# Patient Record
Sex: Female | Born: 1970 | Race: White | Hispanic: No | Marital: Married | Smoking: Never smoker
Health system: Southern US, Community
[De-identification: ages and names within clinical notes are randomized; demographics above are authoritative.]

## PROBLEM LIST (undated history)

## (undated) DIAGNOSIS — T7840XA Allergy, unspecified, initial encounter: Secondary | ICD-10-CM

## (undated) DIAGNOSIS — R7303 Prediabetes: Principal | ICD-10-CM

## (undated) HISTORY — PX: CHOLECYSTECTOMY: SHX55

## (undated) HISTORY — DX: Prediabetes: R73.03

## (undated) HISTORY — DX: Allergy, unspecified, initial encounter: T78.40XA

---

## 2008-08-17 ENCOUNTER — Observation Stay: Payer: Self-pay | Admitting: Obstetrics and Gynecology

## 2008-09-07 ENCOUNTER — Observation Stay: Payer: Self-pay | Admitting: Obstetrics and Gynecology

## 2008-10-17 ENCOUNTER — Inpatient Hospital Stay: Payer: Self-pay

## 2011-12-11 ENCOUNTER — Ambulatory Visit: Payer: Self-pay | Admitting: Obstetrics and Gynecology

## 2012-03-29 LAB — COMPREHENSIVE METABOLIC PANEL
Alkaline Phosphatase: 177 U/L — ABNORMAL HIGH (ref 50–136)
Anion Gap: 8 (ref 7–16)
BUN: 10 mg/dL (ref 7–18)
Bilirubin,Total: 7.5 mg/dL — ABNORMAL HIGH (ref 0.2–1.0)
Co2: 29 mmol/L (ref 21–32)
Creatinine: 0.73 mg/dL (ref 0.60–1.30)
EGFR (Non-African Amer.): >60
Osmolality: 281 (ref 275–301)
SGOT(AST): 459 U/L — ABNORMAL HIGH (ref 15–37)
SGPT (ALT): 915 U/L — ABNORMAL HIGH
Total Protein: 8.2 g/dL (ref 6.4–8.2)

## 2012-03-29 LAB — CBC
HCT: 47.1 % — ABNORMAL HIGH (ref 35.0–47.0)
MCHC: 32.7 g/dL (ref 32.0–36.0)
MCV: 87 fL (ref 80–100)
Platelet: 237 10*3/uL (ref 150–440)
RBC: 5.43 10*6/uL — ABNORMAL HIGH (ref 3.80–5.20)

## 2012-03-29 LAB — URINALYSIS, COMPLETE
Glucose,UR: NEGATIVE mg/dL (ref 0–75)
Ketone: NEGATIVE
Leukocyte Esterase: NEGATIVE
Ph: 6 (ref 4.5–8.0)
Protein: NEGATIVE
Specific Gravity: 1.01 (ref 1.003–1.030)
WBC UR: 1 /HPF (ref 0–5)

## 2012-03-29 LAB — LIPASE, BLOOD: Lipase: 159 U/L (ref 73–393)

## 2012-03-30 ENCOUNTER — Inpatient Hospital Stay: Payer: Self-pay | Admitting: Surgery

## 2012-03-30 LAB — HEPATIC FUNCTION PANEL A (ARMC)
Albumin: 3.5 g/dL (ref 3.4–5.0)
Alkaline Phosphatase: 178 U/L — ABNORMAL HIGH (ref 50–136)
Bilirubin, Direct: 2.9 mg/dL — ABNORMAL HIGH (ref 0.00–0.20)
SGOT(AST): 326 U/L — ABNORMAL HIGH (ref 15–37)
SGPT (ALT): 834 U/L — ABNORMAL HIGH

## 2012-03-30 LAB — CBC WITH DIFFERENTIAL/PLATELET
Basophil #: 0.5 10*3/uL — ABNORMAL HIGH (ref 0.0–0.1)
Eosinophil #: 0.1 10*3/uL (ref 0.0–0.7)
HCT: 44.9 % (ref 35.0–47.0)
HGB: 14.9 g/dL (ref 12.0–16.0)
Lymphocyte #: 1.6 10*3/uL (ref 1.0–3.6)
Lymphocyte %: 22.5 %
MCH: 28.7 pg (ref 26.0–34.0)
MCHC: 33.1 g/dL (ref 32.0–36.0)
MCV: 87 fL (ref 80–100)
Monocyte #: 0.2 x10 3/mm (ref 0.2–0.9)
Neutrophil #: 4.8 10*3/uL (ref 1.4–6.5)
Platelet: 232 10*3/uL (ref 150–440)
RBC: 5.18 10*6/uL (ref 3.80–5.20)
RDW: 13.4 % (ref 11.5–14.5)
WBC: 7.2 10*3/uL (ref 3.6–11.0)

## 2012-03-31 LAB — HEPATIC FUNCTION PANEL A (ARMC)
Albumin: 2.9 g/dL — ABNORMAL LOW (ref 3.4–5.0)
Alkaline Phosphatase: 140 U/L — ABNORMAL HIGH (ref 50–136)
Bilirubin, Direct: 2.5 mg/dL — ABNORMAL HIGH (ref 0.00–0.20)
SGOT(AST): 198 U/L — ABNORMAL HIGH (ref 15–37)
SGPT (ALT): 625 U/L — ABNORMAL HIGH
Total Protein: 6.7 g/dL (ref 6.4–8.2)

## 2012-03-31 LAB — BASIC METABOLIC PANEL
BUN: 6 mg/dL — ABNORMAL LOW (ref 7–18)
Calcium, Total: 8.1 mg/dL — ABNORMAL LOW (ref 8.5–10.1)
Chloride: 106 mmol/L (ref 98–107)
Co2: 26 mmol/L (ref 21–32)
Creatinine: 0.82 mg/dL (ref 0.60–1.30)
EGFR (Non-African Amer.): >60
Glucose: 124 mg/dL — ABNORMAL HIGH (ref 65–99)
Osmolality: 278 (ref 275–301)
Potassium: 3.9 mmol/L (ref 3.5–5.1)
Sodium: 140 mmol/L (ref 136–145)

## 2012-03-31 LAB — CBC WITH DIFFERENTIAL/PLATELET
Eosinophil #: 0.3 10*3/uL (ref 0.0–0.7)
Lymphocyte #: 2.7 10*3/uL (ref 1.0–3.6)
Lymphocyte %: 39.1 %
MCH: 28.7 pg (ref 26.0–34.0)
MCV: 87 fL (ref 80–100)
Monocyte #: 0.4 x10 3/mm (ref 0.2–0.9)
Monocyte %: 5.8 %
Neutrophil %: 50.7 %
Platelet: 187 10*3/uL (ref 150–440)
RDW: 13.3 % (ref 11.5–14.5)
WBC: 6.9 10*3/uL (ref 3.6–11.0)

## 2012-04-01 LAB — CBC WITH DIFFERENTIAL/PLATELET
Basophil #: 0 10*3/uL (ref 0.0–0.1)
Eosinophil #: 0.1 10*3/uL (ref 0.0–0.7)
Eosinophil %: 1.8 %
Lymphocyte #: 2.4 10*3/uL (ref 1.0–3.6)
MCHC: 33.3 g/dL (ref 32.0–36.0)
MCV: 87 fL (ref 80–100)
Monocyte #: 0.4 x10 3/mm (ref 0.2–0.9)
Neutrophil #: 5 10*3/uL (ref 1.4–6.5)
Platelet: 194 10*3/uL (ref 150–440)
RBC: 4.71 10*6/uL (ref 3.80–5.20)
RDW: 13.4 % (ref 11.5–14.5)

## 2012-04-01 LAB — COMPREHENSIVE METABOLIC PANEL
Albumin: 3 g/dL — ABNORMAL LOW (ref 3.4–5.0)
Alkaline Phosphatase: 123 U/L (ref 50–136)
Anion Gap: 7 (ref 7–16)
BUN: 4 mg/dL — ABNORMAL LOW (ref 7–18)
Calcium, Total: 8.3 mg/dL — ABNORMAL LOW (ref 8.5–10.1)
Chloride: 107 mmol/L (ref 98–107)
Co2: 25 mmol/L (ref 21–32)
Creatinine: 0.62 mg/dL (ref 0.60–1.30)
EGFR (Non-African Amer.): >60
Osmolality: 275 (ref 275–301)
Sodium: 139 mmol/L (ref 136–145)
Total Protein: 6.4 g/dL (ref 6.4–8.2)

## 2012-04-01 LAB — LIPASE, BLOOD: Lipase: 459 U/L — ABNORMAL HIGH (ref 73–393)

## 2012-04-01 LAB — HEPATIC FUNCTION PANEL A (ARMC): Bilirubin, Direct: 1.2 mg/dL — ABNORMAL HIGH (ref 0.00–0.20)

## 2012-05-17 ENCOUNTER — Ambulatory Visit: Payer: Self-pay | Admitting: Surgery

## 2012-05-17 LAB — CBC WITH DIFFERENTIAL/PLATELET
Basophil %: 0.4 %
Eosinophil %: 1.7 %
Lymphocyte %: 32 %
MCHC: 33.4 g/dL (ref 32.0–36.0)
MCV: 87 fL (ref 80–100)
Monocyte %: 6.2 %
Neutrophil #: 6.1 10*3/uL (ref 1.4–6.5)
Neutrophil %: 59.7 %
Platelet: 231 10*3/uL (ref 150–440)
RDW: 13.7 % (ref 11.5–14.5)

## 2012-05-17 LAB — HEPATIC FUNCTION PANEL A (ARMC)
Albumin: 3.5 g/dL (ref 3.4–5.0)
Alkaline Phosphatase: 73 U/L (ref 50–136)
Bilirubin, Direct: 0.1 mg/dL (ref 0.00–0.20)
SGOT(AST): 17 U/L (ref 15–37)
Total Protein: 7.5 g/dL (ref 6.4–8.2)

## 2012-05-18 LAB — PATHOLOGY REPORT

## 2012-06-02 ENCOUNTER — Emergency Department: Payer: Self-pay | Admitting: Emergency Medicine

## 2012-06-02 ENCOUNTER — Ambulatory Visit: Payer: Self-pay | Admitting: Gastroenterology

## 2012-06-02 LAB — COMPREHENSIVE METABOLIC PANEL
Albumin: 3.6 g/dL (ref 3.4–5.0)
Anion Gap: 10 (ref 7–16)
BUN: 10 mg/dL (ref 7–18)
Calcium, Total: 8.7 mg/dL (ref 8.5–10.1)
Co2: 26 mmol/L (ref 21–32)
EGFR (Non-African Amer.): >60
Glucose: 129 mg/dL — ABNORMAL HIGH (ref 65–99)
Potassium: 3.9 mmol/L (ref 3.5–5.1)
SGOT(AST): 80 U/L — ABNORMAL HIGH (ref 15–37)
SGPT (ALT): 61 U/L
Sodium: 139 mmol/L (ref 136–145)
Total Protein: 7.6 g/dL (ref 6.4–8.2)

## 2012-06-02 LAB — URINALYSIS, COMPLETE
Bacteria: NONE SEEN
Bilirubin,UR: NEGATIVE
Blood: NEGATIVE
Glucose,UR: NEGATIVE mg/dL (ref 0–75)
Ketone: NEGATIVE
Ph: 5 (ref 4.5–8.0)
RBC,UR: 2 /HPF (ref 0–5)
Specific Gravity: 1.019 (ref 1.003–1.030)
Squamous Epithelial: 4

## 2012-06-02 LAB — CBC
HCT: 42.6 % (ref 35.0–47.0)
MCHC: 34.2 g/dL (ref 32.0–36.0)
MCV: 85 fL (ref 80–100)
RDW: 13.5 % (ref 11.5–14.5)

## 2012-06-02 LAB — LIPASE, BLOOD: Lipase: 126 U/L (ref 73–393)

## 2012-06-02 LAB — PREGNANCY, URINE: Pregnancy Test, Urine: NEGATIVE m[IU]/mL

## 2012-12-13 ENCOUNTER — Ambulatory Visit: Payer: Self-pay | Admitting: Obstetrics and Gynecology

## 2013-12-27 ENCOUNTER — Ambulatory Visit: Payer: Self-pay | Admitting: Obstetrics and Gynecology

## 2015-02-20 NOTE — Consult Note (Signed)
Brief Consult Note: Diagnosis: right upper quadrant pain.   Comments: Patient called answering service today about 10am with c/o epigastric /ruq pain.  She had an ercp to remove a previously placed biliary stent and removal of gallstone, also recent ccy, with prior ercp to attempt removal of gallstone, stent placed at that time.  Patient went to ER on evening of ERCP with c/o pain.  Was evaluated there, thought to have biliary spasm, given pain med, discharged.  Normal lipase. Currently the abdominal pain is smaller in area, but about the same intensity.  Denies n/v or fever. Normal bm. Didnt want to take the narcotic for pain as it made her sleepy with young child at home.  Rx bentyl 10 mgo po q 6 hours prn #10, protonix 40 mg po bid for 3 days, continue daily for a week.  Patient to call Dr Candace Cruise office monday, and to call me back this evening.  Electronic Signatures: Loistine Simas (MD)  (Signed 03-Aug-13 10:29)  Authored: Brief Consult Note   Last Updated: 03-Aug-13 10:29 by Loistine Simas (MD)

## 2015-02-25 NOTE — Consult Note (Signed)
Prob ampullary stenosis. Difficulty with cannulation. Needle knife sphincterotomy performed to access CBD. No obvious stones. Bile drainage with sphincterotomy. 7Fr x 5 cm biliary stent placed. Keep NPO resto of today. Check LFT and lipase in AM. Watch for pancreatitis. Thanks.  Electronic Signatures: Verdie Shire (MD)  (Signed on 29-May-13 18:08)  Authored  Last Updated: 29-May-13 18:08 by Verdie Shire (MD)

## 2015-02-25 NOTE — Consult Note (Signed)
Pt total bilirubin not changed from yesterday.  I think Dr. Candace Cruise will be interested in doing an ERCP before surgery and will talk to him today about the patient.  Electronic Signatures: Manya Silvas (MD)  (Signed on 29-May-13 07:51)  Authored  Last Updated: 29-May-13 07:51 by Manya Silvas (MD)

## 2015-02-25 NOTE — Consult Note (Signed)
PATIENT NAME:  Elizabeth Dalton, Elizabeth Dalton MR#:  [id number] DATE OF BIRTH:  08-27-1971  DATE OF CONSULTATION:  03/30/2012  CONSULTING PHYSICIAN:  Theodore Demark, NP PRIMARY CARE PHYSICIAN: Calla Kicks, MD   HISTORY OF PRESENT ILLNESS: Ms. Delfino Lovett is a pleasant 44 year old Caucasian woman admitted for abdominal pain. GI has been consulted at the request of Dr. Pat Patrick to evaluate for possible common bile duct obstruction. The patient gives a history of rare and infrequent right upper quadrant discomfort that she could not associate with anything that actually recurred three days ago after eating; and she also had increased bloating, acid reflux, heartburn and dyspepsia with it. She does state she ran a low-grade temperature at 99.2 two days ago, but this has resolved. She had no nausea or vomiting. She has not noticed any constipation or difficulty passing stools but did note that her stool was cement-colored. She has no further GI complaints. Her bilirubin has been 7.5, ALP 177, AST 459, ALT 915. She has no leukocytosis, and her labs were pretty much otherwise unremarkable. As noted in the History and Physical, she does deny any history of hepatitis, jaundice, pancreatitis, peptic ulcer disease, gallbladder disease, or other gastrointestinal-related issues. She denies medical problems and surgical problems. Her only hospitalization has been in relation to natural childbirth.   MEDICATIONS: Multivitamin, otherwise no regular medications.   ALLERGIES: No known drug allergies.   SOCIAL HISTORY: Rare alcohol. No tobacco. No illicits. 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   REVIEW OF SYSTEMS: A ten-point review of systems is unremarkable.   FAMILY HISTORY: Mother with colon polyps, otherwise negative for liver disease, colorectal cancer, peptic ulcer disease, gallbladder disease.   LABORATORY, DIAGNOSTIC AND RADIOLOGICAL DATA:  Glucose 150, BUN 10, creatinine 0.73, serum sodium 140,  potassium 4.0, chloride 103, GFR greater than 60, calcium 8.8, lipase 159. Total serum protein 8.2, albumin 3.8, total bilirubin 7.5, ALP 177, AST 459, ALT 915.  WBC 7.9, hemoglobin 15.4, hematocrit 47.1, platelet count 237. Red cells are normocytic. Urinalysis is negative for signs of urinary tract infection.  Abdominal ultrasound did show gallstones and sludge, was negative for gallbladder wall thickness and pericholecystic fluid. Common duct bile duct measured 4 to 5 mm in diameter. Her liver was unremarkable.   PHYSICAL EXAMINATION:  VITAL SIGNS: Most recent vital signs: Temperature 98.2, pulse 78, respiratory rate 20, blood pressure 133/85, oxygen saturation 99%.   GENERAL: Well-appearing woman sitting in a chair grading homework. No acute distress.   PSYCHIATRIC: Pleasant, cooperative, logical train of thought. Good memory skills.   HEENT: Normocephalic, atraumatic. Sclerae with mild jaundice. No redness, drainage, or inflammation to eyes or nares. Mouth with pink moist mucous membranes.   NECK: Supple. No adenopathy, thyromegaly, or JVD.   CHEST: Respirations eupneic. Lungs are clear to auscultation bilaterally.   CARDIAC: S1, S2. Regular rate and rhythm. No MRG. Peripheral pulses are 2+. No appreciable edema.   ABDOMEN: Nondistended. Active bowel sounds x4, nontender, soft. No rebound, tenderness, guarding, masses, hernias, hepatosplenomegaly or peritoneal signs.   GENITOURINARY: Deferred.   RECTAL: Deferred.   EXTREMITIES: Strength five out of five. Moves all extremities well x4. No clubbing or cyanosis.   SKIN: No erythema, lesion or rash. Cranial nerves II through XII are grossly intact. Alert and oriented x3. Speech clear. No facial droop.   IMPRESSION AND PLAN: Abdominal pain, dyspepsia, obstructive pattern of liver enzymes, cholelithiasis and sludge, and abdominal US without signs of acute cholecystitis. She is afebrile, feeling  better today. We will plan for MRCP today  with possible ERCP by Dr. Candace Cruise tomorrow. Thank you for this consult. GI will be happy to follow.  These services were provided by Stephens November, MSN, [place] in collaboration with Manya Silvas, M.D. with whom I have discussed this patient in full.   ____________________________ Theodore Demark, NP chl:cbb D: 03/30/2012 18:14:42 ET T: 03/30/2012 18:50:30 ET JOB#: [id number]  cc: Theodore Demark, NP, <Dictator> [place] SIGNED 03/31/2012 7:04

## 2015-02-25 NOTE — Consult Note (Signed)
Brief Consult Note: Diagnosis: Abdominal pain.   Patient was seen by consultant.   Consult note dictated.   Comments: Appreciate consult for 44 y/o caucasian woman with no significant medical history other than childbirth, for evaluation of possible common bile duct obstruction. Impression and Plan: Abdominal pain, dyspepsia. Obstructive pattern of liver enzymes. Cholelithiasis and sludge on abominal [place] without signs of acute cholecystitis. Afebrile. Feeling better today. Will plan for MRCP today and possible ERCP with Dr Candace Cruise tomorrow.  Thank you for this consult.  Electronic Signatures: Stephens November H (NP)  (Signed 28-May-13 12:56)  Authored: Brief Consult Note   Last Updated: 28-May-13 12:56 by Theodore Demark (NP)

## 2015-02-25 NOTE — Consult Note (Signed)
TB down, MRCP did not show stone.  Will repeat labs in morning and present patient to Dr. Candace Cruise for his decision to do pre op ERCP or not.  Will give clear liq now.  Electronic Signatures: Manya Silvas (MD)  (Signed on 28-May-13 17:57)  Authored  Last Updated: 28-May-13 17:57 by Manya Silvas (MD)

## 2015-02-25 NOTE — Discharge Summary (Signed)
PATIENT NAME:  SZMURIGA CECILA, SATCHER MR#:  [id number] DATE OF BIRTH:  Apr 16, 1971  DATE OF ADMISSION:  03/30/2012 DATE OF DISCHARGE:  04/01/2012  BRIEF HISTORY: Ms. Delfino Lovett is a 44 year old woman admitted through the Emergency Room with signs and symptoms consistent with probable biliary obstruction. She had an elevated bilirubin of 7.5 and ultrasound with stones, but no acute cholecystitis changes and no evidence of common bile duct obstruction. She was admitted and placed on IV antibiotics. The GI service saw the patient. ERCP was performed on the 29th. 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 Her symptoms defervesced and her bilirubin came down to the mid 2 range. She began to feel much better. She did appear to have mild elevation in her lipase post procedure and was felt to have evidence of post procedure pancreatitis. For that reason we elected to delay surgery intervention. She began to tolerate a liquid diet over the course of the day and was discharged home on the evening of the 30th.  She will follow up in the office in 7 to 10 days' time to schedule elective cholecystectomy.   DISCHARGE MEDICATIONS:   Zyrtec qd and Vicodin q4-6h prn  ____________________________ Micheline Maze, MD rle:bjt D: 04/11/2012 07:09:44 ET T: 04/12/2012 12:28:53 ET JOB#: [id number]  cc: Rodena Goldmann III, MD, <Dictator> Rodena Goldmann MD ELECTRONICALLY SIGNED 04/13/2012 8:38

## 2015-02-25 NOTE — Consult Note (Signed)
I discussed case with Dr. Candace Cruise and he plans for an ERCP this afternoon. The patient was informed.  Will order Ancef on call at his request.  I presume GB removal will be done tomorrow.   Electronic Signatures: Manya Silvas (MD)  (Signed on 29-May-13 08:44)  Authored  Last Updated: 29-May-13 08:44 by Manya Silvas (MD)

## 2015-02-25 NOTE — H&P (Signed)
PATIENT NAME:  Elizabeth Dalton, Elizabeth Dalton MR#:  [id number] DATE OF BIRTH:  12-08-1970  DATE OF ADMISSION:  03/30/2012  PRIMARY CARE PHYSICIAN: Dr. Calla Kicks  ADMITTING PHYSICIAN: Dr. Pat Patrick  CHIEF COMPLAINT: Abdominal pain.   BRIEF HISTORY: Elizabeth Dalton is a 44 year old woman seen in the Emergency Room with a several week history of intermittent abdominal pain. Several weeks ago she had an episode of severe right upper quadrant pain associated with some mild bloating and indigestion. She did not have any nausea, did not have any obvious relationship to activity or eating. She suffered another episode a week later which was a little more severe but again passed without intervention. Three days ago she had some increased right upper quadrant pain now with increasing bloating and dyspepsia. He had some mild anorexia but no fever, chills, nausea or vomiting. She felt that she was constipated, took some medication and noted that her stools had turned cement colored or tan colored and with increasing abdominal pain and the change in her symptoms she presented to the Emergency Room for further evaluation.   Work-up in the Emergency Room demonstrated abnormal liver function studies with a bilirubin of 7.5, alkaline phosphatase 177, SGPT 915 and SGOT 459. She had no significant white blood cell count. Otherwise, her laboratory values were unremarkable. Ultrasound was performed which revealed multiple mobile gallstones with no evidence of any acute cholecystitis, no sonographic Murphy sign and no increase in the common bile duct. The surgical service was consulted.   She denies any previous history of GI problems. She denies any history of hepatitis, yellow jaundice, pancreatitis, peptic ulcer disease, previous diagnosis of gallbladder disease, or diverticulitis. She has had no previous abdominal surgery. Her only hospitalization was for an uncomplicated labor and delivery. She denies any cardiac disease,  cardiac dysrhythmia, hypertension, or diabetes. She does have a family history of diabetes.   MEDICATIONS: She takes no medications regularly other than multivitamins.  ALLERGIES: Has no medical allergies.   SOCIAL HISTORY: She does not smoke cigarettes. 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   REVIEW OF SYSTEMS: Unremarkable. 10 point review of systems was carried out and no significant abnormalities were identified other than those noted above.   PHYSICAL EXAMINATION:  GENERAL: She is an alert, pleasant woman in no significant distress from pain at this point. She does not have any significant abdominal symptoms but she has had some pain medication.   VITALS: Blood pressure 158/70, respirations 20, heart rate 96 and regular, oxygen saturation 100%.   HEENT: Reveals muddy-looking sclerae with some jaundice. Pupils were equally round. There is no facial deformity.   NECK: Supple without adenopathy and the trachea is midline.   CHEST: Clear with no adventitious sounds. She has normal pulmonary excursion.   CARDIAC: No murmurs or gallops to my ear and seems to be in normal sinus rhythm.   ABDOMEN: Her abdomen is soft with no abdominal tenderness. No point tenderness. No rebound. No guarding. No masses. No hernias. She has active bowel sounds.   EXTREMITIES: Lower extremity exam reveals full range of motion, no deformities and good distal pulses.   SKIN: Skin exam reveals no obvious lesions and she appears to be mildly jaundiced.   PSYCHIATRIC: Normal orientation. Normal affect.   ASSESSMENT AND PLAN: This woman appears to have ductal obstruction from her clinical picture and her laboratory values. Her ultrasound does not support that diagnosis with a normal sized common bile  duct for such a significant change in her liver function studies. Also her transaminases are elevated in excess of her alkaline phosphatase which  always raises the possible question of hepatocellular disease. Will plan to admit her to the hospital tonight, place her on IV antibiotics, rehydrate her and get the gastroenterologist to see her in the morning with regard to possible M.R.C.P. versus ERCP. This plan has been discussed with the patient and her husband and they are in agreement.   ____________________________ Micheline Maze, MD rle:cms D: 03/30/2012 01:53:01 ET T: 03/30/2012 08:41:21 ET JOB#: [id number]  cc: Micheline Maze, MD, <Dictator> Dory Horn. Eliberto Ivory, MD Boykin Nearing, MD Rodena Goldmann MD ELECTRONICALLY SIGNED 03/31/2012 4:12

## 2015-02-25 NOTE — Op Note (Signed)
PATIENT NAME:  Elizabeth Dalton, SALMELA MR#:  [id number] DATE OF BIRTH:  [birth year]  DATE OF PROCEDURE:  05/17/2012  PREOPERATIVE DIAGNOSIS: Cholelithiasis, choledocholithiasis.   POSTOPERATIVE DIAGNOSIS: Cholelithiasis, choledocholithiasis.     PROCEDURE PERFORMED: Laparoscopic cholecystectomy.   SURGEON: Arden Axon A. Marina Gravel, M.D.   ASSISTANT: CST x 2.  ANESTHESIA: General endotracheal.   SPECIMENS: Gallbladder and contents.  ESTIMATED BLOOD LOSS: 25 mL.  DRAINS: None.  COUNT: LAP and needle count correct x2.   DESCRIPTION OF PROCEDURE: With informed consent obtained from the patient, she was brought to the Operating Room and positioned supine. General endotracheal anesthesia was induced. The left arm was padded and tucked at her side. The abdomen was thoroughly sterilely prepped and draped with ChloraPrep solution. Time-out was observed. A 12 mm blunt Hassan trocar was placed through an infraumbilical transversely oriented skin incision with stay sutures being passed through the fascia. Pneumoperitoneum was established. Remaining trocars were placed with the 5 mm Bladeless trocar in the epigastric region, two 5 mm bladeless trocars in the right upper quadrant. The gallbladder was grasped along its fundus and elevated towards the right shoulder. Lateral traction was placed in Hartmann's pouch. The hepatoduodenal ligament was then incised with blunted technique and the cystic triangle was identified. Critical view of safety cholecystectomy was performed and the gallbladder cystic duct junction was clearly identified. The cystic artery was single in nature. It was doubly clipped on the portal side, singly clipped on the gallbladder side. Further dissection in the area of the cystic duct gallbladder junction was performed. Three clips were then placed on the portal side of the cystic duct, one on the gallbladder side and the structure was then divided. Gallbladder was then released off the gallbladder  fossa with hook cautery apparatus, placed into an EndoCatch device and retrieved. Pneumoperitoneum was re-established. The right upper quadrant was irrigated with 0.5 liters of normal saline, aspirated dry, and one area on the gallbladder fossa was cauterized for hemostasis. The ports were then removed under direct visualization and the infraumbilical fascial defect was closed utilizing interrupted 0 Vicryl suture in vertical orientation. 4-0 Vicryl subcuticular was applied to all skin edges and at the completion of the operation, a total of 30 mL of 0.25% plain Marcaine was infiltrated along all skin and fascial incisions prior to closure. Benzoin, Steri-Strips, Telfa, and Tegaderm were then applied. The patient was then subsequently extubated and taken to the recovery room in stable and satisfactory condition by anesthesia services.   ____________________________ [name] Marina Gravel, MD FACS mab:ap D: 05/17/2012 08:44:26 ET T: 05/17/2012 10:57:30 ET JOB#: [id number]  cc: Elta Guadeloupe A. Marina Gravel, MD, <Dictator> Manya Silvas, MD Lupita Dawn. Candace Cruise, MD Hortencia Conradi MD ELECTRONICALLY SIGNED 05/17/2012 22:25

## 2016-06-16 ENCOUNTER — Other Ambulatory Visit: Payer: Self-pay | Admitting: Family Medicine

## 2016-06-16 ENCOUNTER — Ambulatory Visit
Admission: RE | Admit: 2016-06-16 | Discharge: 2016-06-16 | Disposition: A | Discharge status: Home / Self Care | Payer: BC Managed Care – PPO | Source: Ambulatory Visit | Attending: Family Medicine | Admitting: Family Medicine

## 2016-06-16 DIAGNOSIS — Z1231 Encounter for screening mammogram for malignant neoplasm of breast: Secondary | ICD-10-CM | POA: Diagnosis present

## 2017-05-18 LAB — HM PAP SMEAR: HM Pap smear: NEGATIVE

## 2017-06-05 ENCOUNTER — Encounter: Payer: Self-pay | Admitting: Dietician

## 2017-06-05 ENCOUNTER — Encounter: Payer: BC Managed Care – PPO | Attending: Family Medicine | Admitting: Dietician

## 2017-06-05 VITALS — Ht 67.0 in | Wt 232.0 lb

## 2017-06-05 DIAGNOSIS — E119 Type 2 diabetes mellitus without complications: Secondary | ICD-10-CM | POA: Diagnosis not present

## 2017-06-05 DIAGNOSIS — Z6836 Body mass index (BMI) 36.0-36.9, adult: Secondary | ICD-10-CM

## 2017-06-05 NOTE — Patient Instructions (Signed)
   Choose healthy snacks most of the time, control portions of sugar and starches and add a source of protein to better stabilize blood sugar.  Make sure meals are balanced with protein and generous portions of low-carb veggies, along with controlled amounts of carbs.   Continue building up exercise in terms of frequency and/or duration as you are able, to help control blood sugars.

## 2017-06-05 NOTE — Progress Notes (Signed)
Medical Nutrition Therapy: Visit start time: 0900  end time: 1000  Assessment:  Diagnosis: diabetes Past medical history: gallbladder removal Psychosocial issues/ stress concerns: none Preferred learning method:  . Hands-on  Current weight: 232lbs  Height: 5'7" Medications, supplements: zyrtec  Progress and evaluation: Patient reports no recent diet changes, she feels she generally makes healthy food choices, with the exception of excess sugar. She does not like any artificial sweeteners. Her weight has been stable for past 10 years. She has a strong family history for diabetes, including 2 sisters and mother (mom has Type 1DM). Recent HbA1C was 6.5% on 05/08/17.    Physical activity: walking, biking 30 min or more 2x a week  Dietary Intake:  Usual eating pattern includes 3 meals and 2 snacks per day. Dining out frequency: 1 meals per week.  Breakfast: 2c coffee, usually cereal, or yogurt and fruit Snack: none Lunch: usually leftovers when at school, small portions. Snack: varies in health value. Sometimes healthy snack, sometimes sweets.  Supper: chicken with pasta or rice and veg.; tacos; varies. Often with dessert Snack: occasionally crackers, etc.  Beverages: water, up to 64oz daily; sweet or unsweet tea; soda not daily. 2c coffee in am.   Nutrition Care Education: Topics covered: diabetes Basic nutrition: basic food groups, appropriate nutrient balance, appropriate meal and snack schedule, general nutrition guidelines    Diabetes: appropriate meal and snack schedule, appropriate carb intake and balance, strategies for reducing sugar intake, basic meal planning using plate method and outline and examples of balanced meals, importance of fiber in foods, sweetener options other than white sugar (honey, agave in small amounts), role of exercise in controlling blood sugars.  Other lifestyle changes: strategies for implementing changes gradually.  Nutritional Diagnosis:  [place]-2.2 Altered  nutrition-related laboratory As related to diabetes.  As evidenced by HbA1C of 6.5%. [place]-3.3 Overweight/obesity As related to excess calories, limited activity.  As evidenced by patient report.  Intervention: Instruction as noted above.   Set goals with direction from patient.   Encouraged step-by-step changes.   Education Materials given:  . General diet guidelines for Diabetes . Food lists/ Planning A Balanced Meal . Sample meal pattern/ menus . Snacking handout . Goals/ instructions  Learner/ who was taught:  . Patient   Level of understanding: Marland Kitchen Verbalizes/ demonstrates competency  Demonstrated degree of understanding via:   Teach back Learning barriers: . None  Willingness to learn/ readiness for change: . Acceptance, ready for change  Monitoring and Evaluation:  Dietary intake, exercise, BG control, and body weight      follow up: 07/15/17

## 2017-06-17 ENCOUNTER — Other Ambulatory Visit: Payer: Self-pay | Admitting: Family Medicine

## 2017-06-17 DIAGNOSIS — Z1231 Encounter for screening mammogram for malignant neoplasm of breast: Secondary | ICD-10-CM

## 2017-06-18 ENCOUNTER — Ambulatory Visit
Admission: RE | Admit: 2017-06-18 | Discharge: 2017-06-18 | Disposition: A | Discharge status: Home / Self Care | Payer: BC Managed Care – PPO | Source: Ambulatory Visit | Attending: Family Medicine | Admitting: Family Medicine

## 2017-06-18 DIAGNOSIS — Z1231 Encounter for screening mammogram for malignant neoplasm of breast: Secondary | ICD-10-CM | POA: Diagnosis not present

## 2017-07-15 ENCOUNTER — Encounter: Payer: Self-pay | Admitting: Dietician

## 2017-07-15 ENCOUNTER — Encounter: Payer: BC Managed Care – PPO | Attending: Family Medicine | Admitting: Dietician

## 2017-07-15 VITALS — Ht 67.0 in | Wt 232.6 lb

## 2017-07-15 DIAGNOSIS — E119 Type 2 diabetes mellitus without complications: Secondary | ICD-10-CM

## 2017-07-15 DIAGNOSIS — Z6836 Body mass index (BMI) 36.0-36.9, adult: Secondary | ICD-10-CM

## 2017-07-15 NOTE — Progress Notes (Signed)
Medical Nutrition Therapy: Visit start time: 8182 end time: 1720 Assessment:  Diagnosis: Type 2 diabetes, obesity Medical changes: no changes per patient Psychosocial issues/ stress concerns: none  Current weight: 232.6lbs  Height: 5'7" Medications, supplements: no changes, taking Zyrtec only   Progress and evaluation: Patient reports minor diet changes since initial MNT visit on 06/05/17-- some healthier food and snack choices, still some high sugar choices as well. 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 Her weight has remained stable since previous visit.  Physical activity: walking, bike riding 30 minutes, 2 times a week. Unable to increase due to start of school year.   Dietary Intake:  Usual eating pattern includes 3 meals and 1-2 snacks per day. Dining out frequency: 1 meal per week  Breakfast: cereal, or yogurt and fruit, was eting English muffin with ricotta and cinnamon or honey Snack: none Lunch: small meal, usually leftovers Snack: at school fruit, popcorn, occasional yogurt, candy Supper: chicken with pasta or rice and vegetables, tacos Snack: occasionally if up late -- ice cream or other  Beverages: water, tea -- sweet or unsweet, soda 1-2 times a week, 2c coffee in am.   Nutrition Care Education: Topics covered: diabetes, weight management    Weight control: calculated caloric needs for weight loss at 1300-1500. Provided guidance for carbohydrate, protein, and fat breakdown for balanced diet. Discussed benefits of tracking food intake via phone app or paper diary. Reviewed role of exercise and options for adding exercise during the day.  Diabetes:  appropriate carb intake and balance   Nutritional Diagnosis:  [place]-2.2 Altered nutrition-related laboratory As related to diabetes.  As evidenced by HbA1C of 6.5%. [place]-3.3 Overweight/obesity As related to excess calories, limited activity.  As  evidenced by BMI 36.4, patient report.  Intervention: Discussion as noted above.   Updated goals with input from patient.   She requested next follow-up after retesting HbA1C in November.   Education Materials given:  Marland Kitchen Sample meal pattern . Goals/ instructions  Learner/ who was taught:  . Patient   Level of understanding: Marland Kitchen Verbalizes/ demonstrates competency  Demonstrated degree of understanding via:   Teach back Learning barriers: . None  Willingness to learn/ readiness for change: . Eager, change in progress  Monitoring and Evaluation:  Dietary intake, exercise, BG control, and body weight      follow up: 10/01/17

## 2017-07-15 NOTE — Patient Instructions (Signed)
   Continue to work on ways to increase physical activity, either adding more walking days and/or finding ways to add movement throughout the day.   Try tracking food intake using a free app such as MyFitnessPal, LoseIt, or Spark People. Or keep a pen-and-paper food diary, remembering to include everything you eat as well as food portions.

## 2017-09-18 DIAGNOSIS — E669 Obesity, unspecified: Secondary | ICD-10-CM | POA: Insufficient documentation

## 2017-09-18 DIAGNOSIS — E1165 Type 2 diabetes mellitus with hyperglycemia: Secondary | ICD-10-CM | POA: Insufficient documentation

## 2017-09-18 DIAGNOSIS — Z6836 Body mass index (BMI) 36.0-36.9, adult: Secondary | ICD-10-CM

## 2017-10-01 ENCOUNTER — Ambulatory Visit: Payer: BC Managed Care – PPO | Admitting: Dietician

## 2018-08-25 LAB — HM DIABETES EYE EXAM

## 2019-01-21 ENCOUNTER — Ambulatory Visit: Payer: BC Managed Care – PPO | Admitting: Nurse Practitioner

## 2019-01-21 ENCOUNTER — Encounter: Payer: Self-pay | Admitting: Nurse Practitioner

## 2019-01-21 ENCOUNTER — Other Ambulatory Visit: Payer: Self-pay | Admitting: Nurse Practitioner

## 2019-01-21 ENCOUNTER — Other Ambulatory Visit: Payer: Self-pay

## 2019-01-21 ENCOUNTER — Ambulatory Visit (INDEPENDENT_AMBULATORY_CARE_PROVIDER_SITE_OTHER): Payer: BC Managed Care – PPO | Admitting: Nurse Practitioner

## 2019-01-21 VITALS — BP 156/73 | HR 80 | Temp 98.8°F | Ht 67.0 in | Wt 232.6 lb

## 2019-01-21 DIAGNOSIS — E785 Hyperlipidemia, unspecified: Secondary | ICD-10-CM

## 2019-01-21 DIAGNOSIS — E1165 Type 2 diabetes mellitus with hyperglycemia: Secondary | ICD-10-CM

## 2019-01-21 DIAGNOSIS — R7303 Prediabetes: Secondary | ICD-10-CM | POA: Diagnosis not present

## 2019-01-21 DIAGNOSIS — Z7689 Persons encountering health services in other specified circumstances: Secondary | ICD-10-CM

## 2019-01-21 DIAGNOSIS — Z0000 Encounter for general adult medical examination without abnormal findings: Secondary | ICD-10-CM

## 2019-01-21 NOTE — Progress Notes (Signed)
Subjective:    Patient ID: Elizabeth Dalton, female    DOB: February 03, 1971, 48 y.o.   MRN: [id number]  Elizabeth Dalton is a 48 y.o. female presenting on 01/21/2019 for Establish Care (diagnose w/ pre-diabetes x 35yrs ago)   HPI [place] Provider Pt last seen by PCP about 2 years ago Dr. Peter Congo in [facility] system.  Obtain records as needed.    Prediabetes Stable weight.  Has changed food habits to influence weight downward, but has had no changes in weight over last 10+ years.   - Patient notes no recheck of labs in last 1.5 - 2 years. - Eats generally healthy diet per patient report.  - Walks regularly 30-40 minutes 3-5 days per week. - She is not currently symptomatic and denies polydipsia, polyphagia, polyuria, headaches, diaphoresis, shakiness, chills, pain, numbness or tingling in extremities and changes in vision.      Past Medical History:  Diagnosis Date  . Allergy    Past Surgical History:  Procedure Laterality Date  . CHOLECYSTECTOMY     Social History   Socioeconomic History  . Marital status: Married    Spouse name: Not on file  . Number of children: Not on file  . Years of education: Not on file  . Highest education level: Not on file  Occupational History  . Not on file  Social Needs  . Financial resource strain: Not on file  . Food insecurity:    Worry: Not on file    Inability: Not on file  . Transportation needs:    Medical: Not on file    Non-medical: Not on file  Tobacco Use  . Smoking status: Never Smoker  . Smokeless tobacco: Never Used  Substance and Sexual Activity  . Alcohol use: Yes    Alcohol/week: 1.0 standard drinks    Types: 1 Standard drinks or equivalent per week  . Drug use: Never  . Sexual activity: Not on file  Lifestyle  . Physical activity:    Days per week: Not on file    Minutes per session: Not on file  . Stress: Not on file  Relationships  . Social connections:    Talks on phone: Not on file     Gets together: Not on file    Attends religious service: Not on file    Active member of club or organization: Not on file    Attends meetings of clubs or organizations: Not on file    Relationship status: Not on file  . Intimate partner violence:    Fear of current or ex partner: Not on file    Emotionally abused: Not on file    Physically abused: Not on file    Forced sexual activity: Not on file  Other Topics Concern  . Not on file  Social History Narrative  . Not on file   Family History  Problem Relation Age of Onset  . Lung cancer Mother   . Heart disease Mother   . Heart disease Father   . Breast cancer Neg Hx    Current Outpatient Medications on File Prior to Visit  Medication Sig  . cetirizine (ZYRTEC) 10 MG tablet Take by mouth.   No current facility-administered medications on file prior to visit.     Review of Systems  Constitutional: Negative for activity change, appetite change and fatigue.  Respiratory: Negative for cough and shortness of breath.   Cardiovascular: Negative for chest pain, palpitations and leg swelling.  Gastrointestinal: Negative for constipation, diarrhea, nausea and vomiting.  Genitourinary: Negative for dysuria, frequency and urgency.  Musculoskeletal: Negative for arthralgias and myalgias.  Skin: Negative for rash.  Neurological: Negative for dizziness and headaches.  Psychiatric/Behavioral: Negative for dysphoric mood. The patient is not nervous/anxious.    Per HPI unless specifically indicated above      Objective:    BP (!) 156/73 (BP Location: Left Arm, Patient Position: Sitting, Cuff Size: Normal)   Pulse 80   Temp 98.8 F (37.1 C) (Oral)   Ht 5\' 7"  (1.702 m)   Wt 232 lb 9.6 oz (105.5 kg)   LMP 12/23/2018   BMI 36.43 kg/m   Wt Readings from Last 3 Encounters:  01/21/19 232 lb 9.6 oz (105.5 kg)  07/15/17 232 lb 9.6 oz (105.5 kg)  06/05/17 232 lb (105.2 kg)    Physical Exam Vitals signs reviewed.  Constitutional:       General: She is awake. She is not in acute distress.    Appearance: She is well-developed.  HENT:     Head: Normocephalic and atraumatic.  Neck:     Musculoskeletal: Normal range of motion and neck supple.     Vascular: No carotid bruit.  Cardiovascular:     Rate and Rhythm: Normal rate and regular rhythm.     Pulses:          Radial pulses are 2+ on the right side and 2+ on the left side.       Posterior tibial pulses are 1+ on the right side and 1+ on the left side.     Heart sounds: Normal heart sounds, S1 normal and S2 normal.  Pulmonary:     Effort: Pulmonary effort is normal. No respiratory distress.     Breath sounds: Normal breath sounds and air entry.  Abdominal:     General: Bowel sounds are normal. There is no distension.     Palpations: Abdomen is soft. There is no hepatomegaly or splenomegaly.     Tenderness: There is no abdominal tenderness.     Hernia: No hernia is present.  Skin:    General: Skin is warm and dry.     Capillary Refill: Capillary refill takes less than 2 seconds.  Neurological:     Mental Status: She is alert and oriented to person, place, and time.  Psychiatric:        Attention and Perception: Attention normal.        Mood and Affect: Mood and affect normal.        Behavior: Behavior normal. Behavior is cooperative.        Thought Content: Thought content normal.        Judgment: Judgment normal.    No results found for this or any previous visit.    Assessment & Plan:   Problem List Items Addressed This Visit    None    Visit Diagnoses    Prediabetes    -  Primary Status unknown.  Recheck labs.  Continue without meds today. Continue healthy lifestyle efforts.  Refills provided. Followup prn after labs and in 6 months.   Relevant Orders   Hemoglobin A1c   COMPLETE METABOLIC PANEL WITH GFR   Lipid panel   Encounter to establish care     Previous PCP was Dr. Peter Congo.  Records are reviewed in [place].  Past medical, family,  and surgical history reviewed w/ pt. Return for annual physical prn.      Follow up plan:  Return in about 6 months (around 07/24/2019) for pre-diabetes.  Cassell Smiles, DNP, AGPCNP-BC Adult Gerontology Primary Care Nurse Practitioner [facility] 01/21/2019, 2:33 PM

## 2019-01-21 NOTE — Patient Instructions (Addendum)
Particia Nearing Richard,   Thank you for coming in to clinic today.  1. You will be due for FASTING BLOOD WORK.  This means you should eat no food or drink after midnight.  Drink only water or coffee without cream/sugar on the morning of your lab visit. - Please go ahead and schedule a "Lab Only" visit in the morning at the clinic for lab draw in the next 7 days. - Your results will be available about 2-3 days after blood draw.  If you have set up a MyChart account, you can can log in to MyChart online to view your results and a brief explanation. Also, we can discuss your results together at your next office visit if you would like.  2. Continue your healthy lifestyle.  Please schedule a follow-up appointment with Cassell Smiles, AGNP. Return in about 6 months (around 07/24/2019) for pre-diabetes.  If you have any other questions or concerns, please feel free to call the clinic or send a message through [place]. You may also schedule an earlier appointment if necessary.  You will receive a survey after today's visit either digitally by e-mail or paper by [employer]. Your experiences and feedback matter to [place].  Please respond so we know how we are doing as we provide care for you.   Cassell Smiles, DNP, AGNP-BC Adult Gerontology Nurse Practitioner [place]

## 2019-01-26 LAB — COMPLETE METABOLIC PANEL WITH GFR
AG Ratio: 1.4 (calc) (ref 1.0–2.5)
ALT: 51 U/L — ABNORMAL HIGH (ref 6–29)
AST: 35 U/L (ref 10–35)
Albumin: 4.1 g/dL (ref 3.6–5.1)
Alkaline phosphatase (APISO): 62 U/L (ref 31–125)
BUN: 12 mg/dL (ref 7–25)
CO2: 27 mmol/L (ref 20–32)
Calcium: 9.2 mg/dL (ref 8.6–10.2)
Chloride: 101 mmol/L (ref 98–110)
Creat: 0.89 mg/dL (ref 0.50–1.10)
GFR, Est African American: 89 mL/min/{1.73_m2} (ref >=60)
GFR, Est Non African American: 77 mL/min/{1.73_m2} (ref >=60)
Globulin: 2.9 g/dL (calc) (ref 1.9–3.7)
Glucose, Bld: 126 mg/dL — ABNORMAL HIGH (ref 65–99)
Potassium: 4.7 mmol/L (ref 3.5–5.3)
Sodium: 136 mmol/L (ref 135–146)
Total Bilirubin: 0.9 mg/dL (ref 0.2–1.2)
Total Protein: 7 g/dL (ref 6.1–8.1)

## 2019-01-26 LAB — LIPID PANEL
Cholesterol: 175 mg/dL (ref <200)
HDL: 56 mg/dL (ref >=50)
LDL Cholesterol (Calc): 101 mg/dL (calc) — ABNORMAL HIGH
Non-HDL Cholesterol (Calc): 119 mg/dL (calc) (ref <130)
Total CHOL/HDL Ratio: 3.1 (calc) (ref <5.0)
Triglycerides: 89 mg/dL (ref <150)

## 2019-01-26 LAB — HEMOGLOBIN A1C
Hgb A1c MFr Bld: 6.7 % of total Hgb — ABNORMAL HIGH (ref <5.7)
Mean Plasma Glucose: 146 (calc)
eAG (mmol/L): 8.1 (calc)

## 2019-01-27 ENCOUNTER — Encounter: Payer: Self-pay | Admitting: Nurse Practitioner

## 2019-01-27 MED ORDER — ROSUVASTATIN CALCIUM 10 MG PO TABS: 10.0000 mg | ORAL_TABLET | Freq: Every day | ORAL | 1 refills | Status: DC

## 2019-01-27 MED ORDER — METFORMIN HCL 500 MG PO TABS: 500.0000 mg | ORAL_TABLET | Freq: Every day | ORAL | 1 refills | Status: DC

## 2019-01-27 NOTE — Addendum Note (Signed)
Addended by: Cleaster Corin on: 01/27/2019 01:42 PM   Modules accepted: Orders

## 2019-05-03 ENCOUNTER — Other Ambulatory Visit: Payer: Self-pay

## 2019-05-03 ENCOUNTER — Ambulatory Visit (INDEPENDENT_AMBULATORY_CARE_PROVIDER_SITE_OTHER): Payer: BC Managed Care – PPO | Admitting: Family Medicine

## 2019-05-03 ENCOUNTER — Encounter: Payer: Self-pay | Admitting: Family Medicine

## 2019-05-03 ENCOUNTER — Ambulatory Visit: Payer: BC Managed Care – PPO | Admitting: Nurse Practitioner

## 2019-05-03 VITALS — BP 152/74 | HR 82 | Temp 98.7°F | Resp 16 | Ht 67.0 in | Wt 239.0 lb

## 2019-05-03 DIAGNOSIS — E1165 Type 2 diabetes mellitus with hyperglycemia: Secondary | ICD-10-CM

## 2019-05-03 DIAGNOSIS — N951 Menopausal and female climacteric states: Secondary | ICD-10-CM | POA: Diagnosis not present

## 2019-05-03 LAB — POCT UA - MICROALBUMIN: Microalbumin Ur, POC: 20 mg/L

## 2019-05-03 LAB — POCT GLYCOSYLATED HEMOGLOBIN (HGB A1C): Hemoglobin A1C: 6.8 % — AB (ref 4.0–5.6)

## 2019-05-03 MED ORDER — METFORMIN HCL 500 MG PO TABS: 500.0000 mg | ORAL_TABLET | Freq: Two times a day (BID) | ORAL | 1 refills | Status: DC

## 2019-05-03 NOTE — Assessment & Plan Note (Signed)
History of some normal irregularity in menstrual cycles now considered peri-menopausal No other complication or concern

## 2019-05-03 NOTE — Assessment & Plan Note (Addendum)
New diagnosis, Type 2 Dm, controlled with A1c 6.8, now two readings >6.5 Previously pre-DM for years. Strong fam history DM No hypoglycemia Complications - hyperglycemia  Plan:  1. INCREASE Metformin IR from 500 daily up to 500 BID 2. Encourage improved lifestyle - low carb, low sugar diet, reduce portion size, continue improving regular exercise 3. Continue Statin 4. DM Foot exam done today / Advised to schedule DM ophtho exam, send record 5. Urine microalbumin today - 20, negative - would benefit from ACEi/ARB in future if interested 6. Follow-up 3 months for annual w/ pcp include A1c  Additionally briefly discussed additional newer DM medications, deferred for now

## 2019-05-03 NOTE — Patient Instructions (Addendum)
Thank you for coming to the office today.  Due for Diabetic Eye Exam within 6 months before end of year.  Urine test today for protein  Increase Metformin from 500mg  once daily with meal to TWICE daily. New rx sent.  Keep improving diet as discussed, low carb, reduce portions, limit sweets  Most likely peri-menopausal with the irregularity in periods, usually age 48 to 44, can take few years to resolve or stop menstrual cycle.  DUE for FASTING BLOOD WORK (no food or drink after midnight before the lab appointment, only water or coffee without cream/sugar on the morning of)  SCHEDULE "Lab Only" visit in the morning at the clinic for lab draw in 3 MONTHS   - Make sure Lab Only appointment is at about 1 week before your next appointment, so that results will be available  For Lab Results, once available within 2-3 days of blood draw, you can can log in to MyChart online to view your results and a brief explanation. Also, we can discuss results at next follow-up visit.   Please schedule a Follow-up Appointment to: Return in about 3 months (around 08/03/2019) for 3 months for Annual Physical.  If you have any other questions or concerns, please feel free to call the office or send a message through [place]. You may also schedule an earlier appointment if necessary.  Additionally, you may be receiving a survey about your experience at our office within a few days to 1 week by e-mail or mail. We value your feedback.  Nobie Putnam, DO [place]

## 2019-05-03 NOTE — Progress Notes (Signed)
Subjective:    Patient ID: Elizabeth Dalton, female    DOB: [birth year], 48 y.o.   MRN: [id number]  Elizabeth Dalton is a 48 y.o. female presenting on 05/03/2019 for Diabetes  PCP is Cassell Smiles, AGPCNP-BC - I am currently covering during her maternity leave.   HPI   Type 2 Diabetes, new diagnosis - Last visit with PCP, for initial visit for same problem Pre-Diabetes, treated with metformin new start due to elevated blood sugar, see prior notes for background information. - Interval update with now some worsening stress and some poor diet changes - Current medication - Metformin IR 500mg  daily - She reports that she had considered other medications with PCP last time, considered GLP1 but did not pursue due to history of pancreatitis - Weight gained 7-10 lbs - Diet - Admits some worsening with diet, admits some craving sweets, and stress - Exercise - Continued to walk / garden regularly 30-40 minutes 3-5 days per week, most days - Family history, Mother and Sisters = Diabetics Denies polydipsia, polyphagia, polyuria, headaches, diaphoresis, shakiness, chills, pain, numbness or tingling in extremities and changes in vision.    Peri-menopausal Reports recent >3-6 months of some mild irregular periods, not always on schedule, and some lighter bleeding episodes with menstrual cycle No known family history with early menopause or known timeline on menopause   Depression screen [facility] 2/9 05/03/2019 01/21/2019 06/05/2017  Decreased Interest 0 0 0  Down, Depressed, Hopeless 0 0 0  PHQ - 2 Score 0 0 0    Social History   Tobacco Use  . Smoking status: Never Smoker  . Smokeless tobacco: Never Used  Substance Use Topics  . Alcohol use: Yes    Alcohol/week: 1.0 standard drinks    Types: 1 Standard drinks or equivalent per week  . Drug use: Never    Review of Systems Per HPI unless specifically indicated above     Objective:    BP (!) 152/74   Pulse 82   Temp  98.7 F (37.1 C) (Oral)   Resp 16   Ht 5\' 7"  (1.702 m)   Wt 239 lb (108.4 kg)   BMI 37.43 kg/m   Wt Readings from Last 3 Encounters:  05/03/19 239 lb (108.4 kg)  01/21/19 232 lb 9.6 oz (105.5 kg)  07/15/17 232 lb 9.6 oz (105.5 kg)    Physical Exam Vitals signs and nursing note reviewed.  Constitutional:      General: She is not in acute distress.    Appearance: She is well-developed. She is not diaphoretic.     Comments: Well-appearing, comfortable, cooperative, obese  HENT:     Head: Normocephalic and atraumatic.  Eyes:     General:        Right eye: No discharge.        Left eye: No discharge.     Conjunctiva/sclera: Conjunctivae normal.  Neck:     Musculoskeletal: Normal range of motion and neck supple.     Thyroid: No thyromegaly.  Cardiovascular:     Rate and Rhythm: Normal rate and regular rhythm.     Heart sounds: Normal heart sounds. No murmur.  Pulmonary:     Effort: Pulmonary effort is normal. No respiratory distress.     Breath sounds: Normal breath sounds. No wheezing or rales.  Musculoskeletal: Normal range of motion.  Lymphadenopathy:     Cervical: No cervical adenopathy.  Skin:    General: Skin is warm and dry.  Findings: No erythema or rash.  Neurological:     Mental Status: She is alert and oriented to person, place, and time.  Psychiatric:        Behavior: Behavior normal.     Comments: Well groomed, good eye contact, normal speech and thoughts      Diabetic Foot Exam - Simple   Simple Foot Form Diabetic Foot exam was performed with the following findings: Yes 05/03/2019  2:07 PM  Visual Inspection No deformities, no ulcerations, no other skin breakdown bilaterally: Yes Sensation Testing Intact to touch and monofilament testing bilaterally: Yes Pulse Check Posterior Tibialis and Dorsalis pulse intact bilaterally: Yes Comments    Recent Labs    01/25/19 0832 05/03/19 1528  HGBA1C 6.7* 6.8*      Results for orders placed or  performed in visit on 05/03/19  POCT HgB A1C  Result Value Ref Range   Hemoglobin A1C 6.8 (A) 4.0 - 5.6 %  POCT UA - Microalbumin  Result Value Ref Range   Microalbumin Ur, POC 20 mg/L      Assessment & Plan:   Problem List Items Addressed This Visit    Peri-menopausal    History of some normal irregularity in menstrual cycles now considered peri-menopausal No other complication or concern      Type 2 diabetes mellitus with hyperglycemia (HCC) - Primary    New diagnosis, Type 2 Dm, controlled with A1c 6.8, now two readings >6.5 Previously pre-DM for years. Strong fam history DM No hypoglycemia Complications - hyperglycemia  Plan:  1. INCREASE Metformin IR from 500 daily up to 500 BID 2. Encourage improved lifestyle - low carb, low sugar diet, reduce portion size, continue improving regular exercise 3. Continue Statin 4. DM Foot exam done today / Advised to schedule DM ophtho exam, send record 5. Urine microalbumin today - 20, negative - would benefit from ACEi/ARB in future if interested 6. Follow-up 3 months for annual w/ pcp include A1c  Additionally briefly discussed additional newer DM medications, deferred for now       Relevant Medications   metFORMIN (GLUCOPHAGE) 500 MG tablet   Other Relevant Orders   POCT HgB A1C (Completed)   POCT UA - Microalbumin (Completed)      Meds ordered this encounter  Medications  . metFORMIN (GLUCOPHAGE) 500 MG tablet    Sig: Take 1 tablet (500 mg total) by mouth 2 (two) times daily with a meal.    Dispense:  180 tablet    Refill:  1    Increase dose from once to twice daily    Follow up plan: Return in about 3 months (around 08/03/2019) for 3 months for Annual Physical.   Nobie Putnam, DO [facility] 05/03/2019, 2:07 PM

## 2019-06-10 ENCOUNTER — Other Ambulatory Visit: Payer: Self-pay | Admitting: Nurse Practitioner

## 2019-06-10 DIAGNOSIS — E1165 Type 2 diabetes mellitus with hyperglycemia: Secondary | ICD-10-CM

## 2019-06-10 MED ORDER — METFORMIN HCL 500 MG PO TABS: 500.0000 mg | ORAL_TABLET | Freq: Two times a day (BID) | ORAL | 1 refills | Status: DC

## 2019-06-10 NOTE — Telephone Encounter (Signed)
Pt needs a refill on her metformin 500  [facility] in [place] did not fill her prescription June 30th  CB#  [phone]  [place]

## 2019-08-05 ENCOUNTER — Other Ambulatory Visit: Payer: Self-pay

## 2019-08-05 ENCOUNTER — Ambulatory Visit (INDEPENDENT_AMBULATORY_CARE_PROVIDER_SITE_OTHER): Payer: BC Managed Care – PPO | Admitting: Nurse Practitioner

## 2019-08-05 ENCOUNTER — Encounter: Payer: Self-pay | Admitting: Nurse Practitioner

## 2019-08-05 VITALS — BP 139/75 | HR 74 | Ht 67.0 in | Wt 243.0 lb

## 2019-08-05 DIAGNOSIS — E785 Hyperlipidemia, unspecified: Secondary | ICD-10-CM

## 2019-08-05 DIAGNOSIS — Z23 Encounter for immunization: Secondary | ICD-10-CM | POA: Diagnosis not present

## 2019-08-05 DIAGNOSIS — E1165 Type 2 diabetes mellitus with hyperglycemia: Secondary | ICD-10-CM

## 2019-08-05 DIAGNOSIS — Z6838 Body mass index (BMI) 38.0-38.9, adult: Secondary | ICD-10-CM

## 2019-08-05 LAB — POCT GLYCOSYLATED HEMOGLOBIN (HGB A1C): Hemoglobin A1C: 6.6 % — AB (ref 4.0–5.6)

## 2019-08-05 NOTE — Progress Notes (Signed)
Subjective:    Patient ID: Elizabeth Dalton, female    DOB: [birth year], 48 y.o.   MRN: [id number]  Elizabeth Dalton is a 48 y.o. female presenting on 08/05/2019 for No chief complaint on file.  HPI Diabetes Pt presents today for follow up of Type 2 diabetes mellitus. She is not checking CBG at home.  She does not have a meter. - Current diabetic medications include: metformin - She is not currently symptomatic.  - She denies polydipsia, polyphagia, polyuria, headaches, diaphoresis, shakiness, chills, pain, numbness or tingling in extremities and changes in vision.   - Clinical course has been stable. - She  reports no regular exercise routine, regular gardening/cleaning - less time for this now.  Darker in am so reduced right now. - Her diet is moderate in salt, moderate in fat, and moderate in carbohydrates. - Weight trend: increasing steadily (food and stress since quarantine, working at home with more snacks)  PREVENTION: Eye exam current (within one year): yes - needs a report (Dr. Ellin Mayhew) Foot exam current (within one year): yes  Lipid/ASCVD risk reduction - on statin: rosuvastatin 10 Kidney protection - on ace or arb: not today, patient has up to date microalbumin Recent Labs    01/25/19 0832 05/03/19 1528 08/05/19 0845  HGBA1C 6.7* 6.8* 6.6*   Obesity: patient notes more snacking/stress eating with Covid pandemic, lifestyle changes.  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  Social History   Tobacco Use  . Smoking status: Never Smoker  . Smokeless tobacco: Never Used  Substance Use Topics  . Alcohol use: Yes    Alcohol/week: 1.0 standard drinks    Types: 1 Standard drinks or equivalent per week  . Drug use: Never    Review of Systems Per HPI unless specifically indicated above     Objective:    BP 139/75 (BP Location: Left Arm, Patient Position: Sitting)   Pulse 74   Ht 5\' 7"  (1.702 m)   Wt 243 lb  (110.2 kg)   BMI 38.06 kg/m   Wt Readings from Last 3 Encounters:  08/05/19 243 lb (110.2 kg)  05/03/19 239 lb (108.4 kg)  01/21/19 232 lb 9.6 oz (105.5 kg)    Physical Exam Vitals signs reviewed.  Constitutional:      General: She is not in acute distress.    Appearance: She is well-developed.  HENT:     Head: Normocephalic and atraumatic.  Cardiovascular:     Rate and Rhythm: Normal rate and regular rhythm.     Pulses:          Radial pulses are 2+ on the right side and 2+ on the left side.       Posterior tibial pulses are 2+ on the right side and 2+ on the left side.     Heart sounds: Normal heart sounds, S1 normal and S2 normal.  Pulmonary:     Effort: Pulmonary effort is normal. No respiratory distress.     Breath sounds: Normal breath sounds and air entry.  Musculoskeletal:     Right lower leg: No edema.     Left lower leg: No edema.  Skin:    General: Skin is warm and dry.     Capillary Refill: Capillary refill takes less than 2 seconds.  Neurological:     Mental Status: She is alert and oriented to person, place, and time.  Psychiatric:  Attention and Perception: Attention normal.        Mood and Affect: Mood and affect normal.        Behavior: Behavior normal. Behavior is cooperative.        Thought Content: Thought content normal.        Judgment: Judgment normal.    Results for orders placed or performed in visit on 08/05/19  POCT glycosylated hemoglobin (Hb A1C)  Result Value Ref Range   Hemoglobin A1C 6.6 (A) 4.0 - 5.6 %   HbA1c POC (<> result, manual entry)     HbA1c, POC (prediabetic range)     HbA1c, POC (controlled diabetic range)        Assessment & Plan:   Problem List Items Addressed This Visit      Endocrine   Type 2 diabetes mellitus with hyperglycemia ([place]) - Primary Controlled DM with A1c 6.6% stable from prior visit and goal A1c < 7.0%. - No known complications or hypoglycemia.  Plan:  1. Continue current therapy: metformin   2. Encourage improved lifestyle: - low carb/low glycemic diet reinforced prior education - Increase physical activity to 30 minutes most days of the week.  Explained that increased physical activity increases body's use of sugar for energy. 3. Check fasting am CBG and bring log to next visit for review 4. Continue off ace/arb due to no microalbuminuria, ASA and Statin 5. Advised to schedule DM ophtho exam, send record. 6. Refilled metformin and rosuvastatin. 7. Follow-up 3 mos    Relevant Orders   POCT glycosylated hemoglobin (Hb A1C) (Completed)     Other   Class 2 obesity with body mass index (BMI) of 36.0 to 36.9 in adult Worsening with stress eating, sugars, snacks, portions too large. - Encouraged good lifestyle improvement with less snacking, weight loss slowly over time. - Increase physical activity - follow-up 3 months.    Other Visit Diagnoses    Needs flu shot       Relevant Orders   Flu Vaccine QUAD 6+ mos PF IM (Fluarix Quad PF) (Completed)      Follow up plan: Return in about 3 months (around 11/05/2019) for diabetes.  Cassell Smiles, DNP, AGPCNP-BC Adult Gerontology Primary Care Nurse Practitioner [facility] 08/05/2019, 8:40 AM

## 2019-08-11 ENCOUNTER — Encounter: Payer: Self-pay | Admitting: Nurse Practitioner

## 2019-08-11 MED ORDER — METFORMIN HCL 500 MG PO TABS: 500.0000 mg | ORAL_TABLET | Freq: Two times a day (BID) | ORAL | 1 refills | Status: DC

## 2019-08-11 MED ORDER — ROSUVASTATIN CALCIUM 10 MG PO TABS: 10.0000 mg | ORAL_TABLET | Freq: Every day | ORAL | 1 refills | Status: DC

## 2019-10-31 ENCOUNTER — Telehealth: Payer: Self-pay | Admitting: Family Medicine

## 2019-10-31 DIAGNOSIS — Z0000 Encounter for general adult medical examination without abnormal findings: Secondary | ICD-10-CM

## 2019-10-31 NOTE — Telephone Encounter (Signed)
Signed orders  Nobie Putnam, [facility] 10/31/2019, 1:45 PM

## 2019-11-01 ENCOUNTER — Other Ambulatory Visit: Payer: BC Managed Care – PPO

## 2019-11-01 ENCOUNTER — Other Ambulatory Visit: Payer: Self-pay

## 2019-11-01 DIAGNOSIS — Z0000 Encounter for general adult medical examination without abnormal findings: Secondary | ICD-10-CM

## 2019-11-02 LAB — COMPLETE METABOLIC PANEL WITH GFR
AG Ratio: 1.4 (calc) (ref 1.0–2.5)
ALT: 36 U/L — ABNORMAL HIGH (ref 6–29)
AST: 23 U/L (ref 10–35)
Albumin: 4 g/dL (ref 3.6–5.1)
Alkaline phosphatase (APISO): 67 U/L (ref 31–125)
BUN: 12 mg/dL (ref 7–25)
CO2: 28 mmol/L (ref 20–32)
Calcium: 9.1 mg/dL (ref 8.6–10.2)
Chloride: 103 mmol/L (ref 98–110)
Creat: 0.75 mg/dL (ref 0.50–1.10)
GFR, Est African American: 109 mL/min/{1.73_m2} (ref >=60)
GFR, Est Non African American: 94 mL/min/{1.73_m2} (ref >=60)
Globulin: 2.8 g/dL (calc) (ref 1.9–3.7)
Glucose, Bld: 130 mg/dL — ABNORMAL HIGH (ref 65–99)
Potassium: 4.6 mmol/L (ref 3.5–5.3)
Sodium: 137 mmol/L (ref 135–146)
Total Bilirubin: 0.5 mg/dL (ref 0.2–1.2)
Total Protein: 6.8 g/dL (ref 6.1–8.1)

## 2019-11-02 LAB — CBC WITH DIFFERENTIAL/PLATELET
Absolute Monocytes: 451 cells/uL (ref 200–950)
Basophils Absolute: 49 cells/uL (ref 0–200)
Basophils Relative: 0.5 %
Eosinophils Absolute: 167 cells/uL (ref 15–500)
Eosinophils Relative: 1.7 %
HCT: 44.8 % (ref 35.0–45.0)
Hemoglobin: 14.9 g/dL (ref 11.7–15.5)
Lymphs Abs: 3440 cells/uL (ref 850–3900)
MCH: 28.4 pg (ref 27.0–33.0)
MCHC: 33.3 g/dL (ref 32.0–36.0)
MCV: 85.3 fL (ref 80.0–100.0)
MPV: 9.7 fL (ref 7.5–12.5)
Monocytes Relative: 4.6 %
Neutro Abs: 5694 cells/uL (ref 1500–7800)
Neutrophils Relative %: 58.1 %
Platelets: 240 10*3/uL (ref 140–400)
RBC: 5.25 10*6/uL — ABNORMAL HIGH (ref 3.80–5.10)
RDW: 12.2 % (ref 11.0–15.0)
Total Lymphocyte: 35.1 %
WBC: 9.8 10*3/uL (ref 3.8–10.8)

## 2019-11-02 LAB — LIPID PANEL
Cholesterol: 134 mg/dL (ref <200)
HDL: 55 mg/dL (ref >=50)
LDL Cholesterol (Calc): 60 mg/dL (calc)
Non-HDL Cholesterol (Calc): 79 mg/dL (calc) (ref <130)
Total CHOL/HDL Ratio: 2.4 (calc) (ref <5.0)
Triglycerides: 103 mg/dL (ref <150)

## 2019-11-02 LAB — TSH: TSH: 2.58 mIU/L

## 2019-11-07 ENCOUNTER — Other Ambulatory Visit: Payer: Self-pay

## 2019-11-07 ENCOUNTER — Encounter: Payer: Self-pay | Admitting: Family Medicine

## 2019-11-07 ENCOUNTER — Ambulatory Visit (INDEPENDENT_AMBULATORY_CARE_PROVIDER_SITE_OTHER): Payer: BC Managed Care – PPO | Admitting: Family Medicine

## 2019-11-07 VITALS — Temp 97.7°F

## 2019-11-07 DIAGNOSIS — M25512 Pain in left shoulder: Secondary | ICD-10-CM

## 2019-11-07 DIAGNOSIS — M7542 Impingement syndrome of left shoulder: Secondary | ICD-10-CM

## 2019-11-07 NOTE — Patient Instructions (Addendum)
Stay tuned for [facility]) referral - call us or them within 1 week if not heard back yet  You can take Aleve OTC x 2 per dose twice a day with meal if need  Recommend to start taking Tylenol Extra Strength 500mg  tabs - take 1 to 2 tabs per dose (max 1000mg ) every 6-8 hours for pain (take regularly, don't skip a dose for next 7 days), max 24 hour daily dose is 6 tablets or 3000mg . In the future you can repeat the same everyday Tylenol course for 1-2 weeks at a time.   If not improving as expected or keeps waking you up - call our office 1-2 weeks - we can add a muscle relaxant to help sleep.  Try range of motion exercises copied below   Please schedule a Follow-up Appointment to: Return in about 4 weeks (around 12/05/2019), or if symptoms worsen or fail to improve, for L shoulder.  If you have any other questions or concerns, please feel free to call the office or send a message through [place]. You may also schedule an earlier appointment if necessary.  Additionally, you may be receiving a survey about your experience at our office within a few days to 1 week by e-mail or mail. We value your feedback.  Nobie Putnam, DO [facility], [facility]  Range of Motion Shoulder Exercises  [place] with your good arm against a counter or table for support [facility] forward with a wide stance (make sure your body is comfortable) - Your painful shoulder should hang down and feel "heavy" - Gently move your painful arm in small circles "clockwise" for several turns - Switch to "counterclockwise" for several turns - Early on keep circles narrow and move slowly - Later in rehab, move in larger circles and faster movement   Wall Crawl - Stand close (about 1-2 ft away) to a wall, facing it directly - Reach out with your arm of painful shoulder and place fingers (not palm) on wall - You should make contact with wall at your waist level - Slowly walk  your fingers up the wall. Stay in contact with wall entire time, do not remove fingers - Keep walking fingers up wall until you reach shoulder level - You may feel tightening or mild discomfort, once you reach a height that causes pain or if you are already above your shoulder height then stop. Repeat from starting position. - Early on stand closer to wall, move fingers slowly, and stay at or below shoulder level - Later in rehab, stand farther away from wall (fingertips), move fingers quicker, go above shoulder level

## 2019-11-07 NOTE — Progress Notes (Signed)
Virtual Visit via Telephone The purpose of this virtual visit is to provide medical care while limiting exposure to the novel coronavirus (COVID19) for both patient and office staff.  Consent was obtained for phone visit:  Yes.   Answered questions that patient had about telehealth interaction:  Yes.   I discussed the limitations, risks, security and privacy concerns of performing an evaluation and management service by telephone. I also discussed with the patient that there may be a patient responsible charge related to this service. The patient expressed understanding and agreed to proceed.  Patient Location: Home Provider Location: [facility])  ---------------------------------------------------------------------- Chief Complaint  Patient presents with  . Shoulder Pain    left shoulder discomfort. Pt concern it could be possibly frozen shoulder, difficulty with ROM. She state that she is not able to lift her arm above her head.     S: Reviewed CMA documentation. I have called patient and gathered additional HPI as follows:  LEFT SHOULDER PAIN Reports that symptoms started gradually over past few weeks, without known injury. 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 - Difficulty now with lifting Left shoulder beyond 90* position. - Shoulder is not painful at rest, but can often cause episodes or twinges of pain that feel like sharp pain in shoulder - Tried OTC Aleve occasionally limited relief - Denies radiating pain into arm or hand, numbness tingling weakness   Denies any high risk travel to areas of current concern for COVID19. Denies any known or suspected exposure to person with or possibly with COVID19.  Denies any fevers, chills, sweats, body ache, cough, shortness of breath, sinus pain or pressure, headache, abdominal pain, diarrhea  Past Medical History:  Diagnosis Date  .  Allergy    Social History   Tobacco Use  . Smoking status: Never Smoker  . Smokeless tobacco: Never Used  Substance Use Topics  . Alcohol use: Yes    Alcohol/week: 1.0 standard drinks    Types: 1 Standard drinks or equivalent per week  . Drug use: Never    Current Outpatient Medications:  .  cetirizine (ZYRTEC) 10 MG tablet, Take by mouth., Disp: , Rfl:  .  metFORMIN (GLUCOPHAGE) 500 MG tablet, Take 1 tablet (500 mg total) by mouth 2 (two) times daily with a meal., Disp: 180 tablet, Rfl: 1 .  rosuvastatin (CRESTOR) 10 MG tablet, Take 1 tablet (10 mg total) by mouth daily., Disp: 90 tablet, Rfl: 1  Depression screen [facility] 2/9 05/03/2019 01/21/2019 06/05/2017  Decreased Interest 0 0 0  Down, Depressed, Hopeless 0 0 0  PHQ - 2 Score 0 0 0    No flowsheet data found.  -------------------------------------------------------------------------- O: No physical exam performed due to remote telephone encounter.  Lab results reviewed.  Recent Results (from the past 2160 hour(s))  TSH     Status: None   Collection Time: 11/01/19  8:22 AM  Result Value Ref Range   TSH 2.58 mIU/L    Comment:           Reference Range .           > or = 20 Years  0.40-4.50 .                Pregnancy Ranges           First trimester    0.26-2.66  Second trimester   0.55-2.73           Third trimester    0.43-2.91   Lipid panel     Status: None   Collection Time: 11/01/19  8:22 AM  Result Value Ref Range   Cholesterol 134 <200 mg/dL   HDL 55 > OR = 50 mg/dL   Triglycerides 103 <150 mg/dL   LDL Cholesterol (Calc) 60 mg/dL (calc)    Comment: Reference range: <100 . Desirable range <100 mg/dL for primary prevention;   <70 mg/dL for patients with CHD or diabetic patients  with > or = 2 CHD risk factors. Marland Kitchen LDL-C is now calculated using the Martin-Hopkins  calculation, which is a validated novel method providing  better accuracy than the Friedewald equation in the  estimation of LDL-C.   Cresenciano Genre et al. Annamaria Helling. [id number]): 2061-2068  (http://education.[facility].com/faq/FAQ164)    Total CHOL/HDL Ratio 2.4 <5.0 (calc)   Non-HDL Cholesterol (Calc) 79 <130 mg/dL (calc)    Comment: For patients with diabetes plus 1 major ASCVD risk  factor, treating to a non-HDL-C goal of <100 mg/dL  (LDL-C of <70 mg/dL) is considered a therapeutic  option.   COMPLETE METABOLIC PANEL WITH GFR     Status: Abnormal   Collection Time: 11/01/19  8:22 AM  Result Value Ref Range   Glucose, Bld 130 (H) 65 - 99 mg/dL    Comment: .            Fasting reference interval . For someone without known diabetes, a glucose value >125 mg/dL indicates that they may have diabetes and this should be confirmed with a follow-up test. .    BUN 12 7 - 25 mg/dL   Creat 0.75 0.50 - 1.10 mg/dL   GFR, Est Non African American 94 > OR = 60 mL/min/1.82m2   GFR, Est African American 109 > OR = 60 mL/min/1.67m2   BUN/Creatinine Ratio NOT APPLICABLE 6 - 22 (calc)   Sodium 137 135 - 146 mmol/L   Potassium 4.6 3.5 - 5.3 mmol/L   Chloride 103 98 - 110 mmol/L   CO2 28 20 - 32 mmol/L   Calcium 9.1 8.6 - 10.2 mg/dL   Total Protein 6.8 6.1 - 8.1 g/dL   Albumin 4.0 3.6 - 5.1 g/dL   Globulin 2.8 1.9 - 3.7 g/dL (calc)   AG Ratio 1.4 1.0 - 2.5 (calc)   Total Bilirubin 0.5 0.2 - 1.2 mg/dL   Alkaline phosphatase (APISO) 67 31 - 125 U/L   AST 23 10 - 35 U/L   ALT 36 (H) 6 - 29 U/L  CBC with Differential/Platelet     Status: Abnormal   Collection Time: 11/01/19  8:22 AM  Result Value Ref Range   WBC 9.8 3.8 - 10.8 Thousand/uL   RBC 5.25 (H) 3.80 - 5.10 Million/uL   Hemoglobin 14.9 11.7 - 15.5 g/dL   HCT 44.8 35.0 - 45.0 %   MCV 85.3 80.0 - 100.0 fL   MCH 28.4 27.0 - 33.0 pg   MCHC 33.3 32.0 - 36.0 g/dL   RDW 12.2 11.0 - 15.0 %   Platelets 240 140 - 400 Thousand/uL   MPV 9.7 7.5 - 12.5 fL   Neutro Abs 5,694 1,500 - 7,800 cells/uL   Lymphs Abs 3,440 850 - 3,900 cells/uL   Absolute Monocytes 451 200 - 950  cells/uL   Eosinophils Absolute 167 15 - 500 cells/uL   Basophils Absolute 49 0 - 200 cells/uL   Neutrophils Relative %  58.1 %   Total Lymphocyte 35.1 %   Monocytes Relative 4.6 %   Eosinophils Relative 1.7 %   Basophils Relative 0.5 %    -------------------------------------------------------------------------- A&P:  Problem List Items Addressed This Visit    None    Visit Diagnoses    Acute pain of left shoulder    -  Primary   Relevant Orders   Ambulatory referral to Orthopedic Surgery   Shoulder impingement, left       Relevant Orders   Ambulatory referral to Orthopedic Surgery     Consistent with acute vs subacute LEFT-shoulder bursitis vs rotator cuff tendinopathy with some reduced ROM and episodic pain No clear etiology of injury.  - No imaging on chart  Plan: 1. Referral to [facility] for further eval / imaging and likely PT - Offered NSAID but she will take OTC aleve PRN 2. May take Tylenol Ex Str 1-2 q 6 hr PRN 3. Relative rest but keep shoulder mobile, recommended ROM exercises, avoid heavy lifting 4. May try heating pad PRN   Orders Placed This Encounter  Procedures  . Ambulatory referral to Orthopedic Surgery    Referral Priority:   Routine    Referral Type:   Surgical    Referral Reason:   Specialty Services Required    Requested Specialty:   Orthopedic Surgery    Number of Visits Requested:   1     No orders of the defined types were placed in this encounter.   Follow-up: - Return in 4-6 weeks as needed  Patient verbalizes understanding with the above medical recommendations including the limitation of remote medical advice.  Specific follow-up and call-back criteria were given for patient to follow-up or seek medical care more urgently if needed.   - Time spent in direct consultation with patient on phone: 9 minutes   Nobie Putnam, [facility] 11/07/2019, 8:13 AM

## 2019-11-10 ENCOUNTER — Ambulatory Visit (INDEPENDENT_AMBULATORY_CARE_PROVIDER_SITE_OTHER): Payer: BC Managed Care – PPO | Admitting: Physician Assistant

## 2019-11-10 ENCOUNTER — Encounter: Payer: Self-pay | Admitting: Physician Assistant

## 2019-11-10 ENCOUNTER — Other Ambulatory Visit: Payer: Self-pay

## 2019-11-10 VITALS — Temp 97.7°F

## 2019-11-10 DIAGNOSIS — Z1231 Encounter for screening mammogram for malignant neoplasm of breast: Secondary | ICD-10-CM

## 2019-11-10 DIAGNOSIS — Z0000 Encounter for general adult medical examination without abnormal findings: Secondary | ICD-10-CM

## 2019-11-10 DIAGNOSIS — E1165 Type 2 diabetes mellitus with hyperglycemia: Secondary | ICD-10-CM | POA: Diagnosis not present

## 2019-11-10 MED ORDER — BLOOD GLUCOSE MONITOR KIT: PACK | 0 refills | Status: DC

## 2019-11-10 NOTE — Progress Notes (Signed)
Subjective:    Patient ID: Elizabeth Dalton, female    DOB: 1971-05-08, 49 y.o.   MRN: [id number]  [name] is a 49 y.o. female presenting on 11/10/2019 for Annual Exam  Virtual Visit via Telephone Note  I connected with Elizabeth Dalton on 11/10/19 at  3:00 PM EST by telephone and verified that I am speaking with the correct person using two identifiers.   I discussed the limitations, risks, security and privacy concerns of performing an evaluation and management service by telephone and the availability of in person appointments. I also discussed with the patient that there may be a patient responsible charge related to this service. The patient expressed understanding and agreed to proceed.   HPI   Last PAP: 05/08/2017 normal and negative for HPV from [facility]: no family history  Mammogram: 2018 normal, would like to repeat again this year.   Pneumo 23: declined.   Social History   Tobacco Use  . Smoking status: Never Smoker  . Smokeless tobacco: Never Used  Substance Use Topics  . Alcohol use: Yes    Alcohol/week: 1.0 standard drinks    Types: 1 Standard drinks or equivalent per week  . Drug use: Never    Review of Systems Per HPI unless specifically indicated above     Objective:    Temp 97.7 F (36.5 C) (Oral)   Wt Readings from Last 3 Encounters:  08/05/19 243 lb (110.2 kg)  05/03/19 239 lb (108.4 kg)  01/21/19 232 lb 9.6 oz (105.5 kg)    Physical Exam Results for orders placed or performed in visit on 11/01/19  TSH  Result Value Ref Range   TSH 2.58 mIU/L  Lipid panel  Result Value Ref Range   Cholesterol 134 <200 mg/dL   HDL 55 > OR = 50 mg/dL   Triglycerides 103 <150 mg/dL   LDL Cholesterol (Calc) 60 mg/dL (calc)   Total CHOL/HDL Ratio 2.4 <5.0 (calc)   Non-HDL Cholesterol (Calc) 79 <130 mg/dL (calc)  COMPLETE METABOLIC PANEL WITH GFR  Result Value Ref Range   Glucose, Bld 130 (H) 65 - 99 mg/dL   BUN 12 7 - 25 mg/dL   Creat 0.75 0.50 - 1.10 mg/dL   GFR, Est Non African American 94 > OR = 60 mL/min/1.39m   GFR, Est African American 109 > OR = 60 mL/min/1.735m  BUN/Creatinine Ratio NOT APPLICABLE 6 - 22 (calc)   Sodium 137 135 - 146 mmol/L   Potassium 4.6 3.5 - 5.3 mmol/L   Chloride 103 98 - 110 mmol/L   CO2 28 20 - 32 mmol/L   Calcium 9.1 8.6 - 10.2 mg/dL   Total Protein 6.8 6.1 - 8.1 g/dL   Albumin 4.0 3.6 - 5.1 g/dL   Globulin 2.8 1.9 - 3.7 g/dL (calc)   AG Ratio 1.4 1.0 - 2.5 (calc)   Total Bilirubin 0.5 0.2 - 1.2 mg/dL   Alkaline phosphatase (APISO) 67 31 - 125 U/L   AST 23 10 - 35 U/L   ALT 36 (H) 6 - 29 U/L  CBC with Differential/Platelet  Result Value Ref Range   WBC 9.8 3.8 - 10.8 Thousand/uL   RBC 5.25 (H) 3.80 - 5.10 Million/uL   Hemoglobin 14.9 11.7 - 15.5 g/dL   HCT 44.8 35.0 - 45.0 %   MCV 85.3 80.0 - 100.0 fL   MCH 28.4 27.0 - 33.0 pg   MCHC 33.3 32.0 - 36.0 g/dL  RDW 12.2 11.0 - 15.0 %   Platelets 240 140 - 400 Thousand/uL   MPV 9.7 7.5 - 12.5 fL   Neutro Abs 5,694 1,500 - 7,800 cells/uL   Lymphs Abs 3,440 850 - 3,900 cells/uL   Absolute Monocytes 451 200 - 950 cells/uL   Eosinophils Absolute 167 15 - 500 cells/uL   Basophils Absolute 49 0 - 200 cells/uL   Neutrophils Relative % 58.1 %   Total Lymphocyte 35.1 %   Monocytes Relative 4.6 %   Eosinophils Relative 1.7 %   Basophils Relative 0.5 %      Assessment & Plan:  1. Annual physical exam   2. Encounter for screening mammogram for malignant neoplasm of breast  Explained self scheduling process at [facility].   - MM Digital Screening; Future  3. Type 2 diabetes mellitus with hyperglycemia, without long-term current use of insulin (HCC)  - blood glucose meter kit and supplies KIT; Dispense based on patient and insurance preference. Use up to four times daily as directed. (FOR ICD-9 250.00, 250.01).  Dispense: 1 each; Refill: 0   Follow up: 3-4 months for diabetes and  A1c  Carles Collet, PA-C [facility] 11/10/2019, 3:12 PM

## 2019-11-10 NOTE — Patient Instructions (Signed)
Health Maintenance, Female Adopting a healthy lifestyle and getting preventive care are important in promoting health and wellness. Ask your health care provider about:  The right schedule for you to have regular tests and exams.  Things you can do on your own to prevent diseases and keep yourself healthy. What should I know about diet, weight, and exercise? Eat a healthy diet   Eat a diet that includes plenty of vegetables, fruits, low-fat dairy products, and lean protein.  Do not eat a lot of foods that are high in solid fats, added sugars, or sodium. Maintain a healthy weight Body mass index (BMI) is used to identify weight problems. It estimates body fat based on height and weight. Your health care provider can help determine your BMI and help you achieve or maintain a healthy weight. Get regular exercise Get regular exercise. This is one of the most important things you can do for your health. Most adults should:  Exercise for at least 150 minutes each week. The exercise should increase your heart rate and make you sweat (moderate-intensity exercise).  Do strengthening exercises at least twice a week. This is in addition to the moderate-intensity exercise.  Spend less time sitting. Even light physical activity can be beneficial. Watch cholesterol and blood lipids Have your blood tested for lipids and cholesterol at 49 years of age, then have this test every 5 years. Have your cholesterol levels checked more often if:  Your lipid or cholesterol levels are high.  You are older than 49 years of age.  You are at high risk for heart disease. What should I know about cancer screening? Depending on your health history and family history, you may need to have cancer screening at various ages. This may include screening for:  Breast cancer.  Cervical cancer.  Colorectal cancer.  Skin cancer.  Lung cancer. What should I know about heart disease, diabetes, and high blood  pressure? Blood pressure and heart disease  High blood pressure causes heart disease and increases the risk of stroke. This is more likely to develop in people who have high blood pressure readings, are of African descent, or are overweight.  Have your blood pressure checked: ? Every 3-5 years if you are 18-39 years of age. ? Every year if you are 40 years old or older. Diabetes Have regular diabetes screenings. This checks your fasting blood sugar level. Have the screening done:  Once every three years after age 40 if you are at a normal weight and have a low risk for diabetes.  More often and at a younger age if you are overweight or have a high risk for diabetes. What should I know about preventing infection? Hepatitis B If you have a higher risk for hepatitis B, you should be screened for this virus. Talk with your health care provider to find out if you are at risk for hepatitis B infection. Hepatitis C Testing is recommended for:  Everyone born from 1945 through 1965.  Anyone with known risk factors for hepatitis C. Sexually transmitted infections (STIs)  Get screened for STIs, including gonorrhea and chlamydia, if: ? You are sexually active and are younger than 49 years of age. ? You are older than 49 years of age and your health care provider tells you that you are at risk for this type of infection. ? Your sexual activity has changed since you were last screened, and you are at increased risk for chlamydia or gonorrhea. Ask your health care provider if   you are at risk.  Ask your health care provider about whether you are at high risk for HIV. Your health care provider may recommend a prescription medicine to help prevent HIV infection. If you choose to take medicine to prevent HIV, you should first get tested for HIV. You should then be tested every 3 months for as long as you are taking the medicine. Pregnancy  If you are about to stop having your period (premenopausal) and  you may become pregnant, seek counseling before you get pregnant.  Take 400 to 800 micrograms (mcg) of folic acid every day if you become pregnant.  Ask for birth control (contraception) if you want to prevent pregnancy. Osteoporosis and menopause Osteoporosis is a disease in which the bones lose minerals and strength with aging. This can result in bone fractures. If you are 65 years old or older, or if you are at risk for osteoporosis and fractures, ask your health care provider if you should:  Be screened for bone loss.  Take a calcium or vitamin D supplement to lower your risk of fractures.  Be given hormone replacement therapy (HRT) to treat symptoms of menopause. Follow these instructions at home: Lifestyle  Do not use any products that contain nicotine or tobacco, such as cigarettes, e-cigarettes, and chewing tobacco. If you need help quitting, ask your health care provider.  Do not use street drugs.  Do not share needles.  Ask your health care provider for help if you need support or information about quitting drugs. Alcohol use  Do not drink alcohol if: ? Your health care provider tells you not to drink. ? You are pregnant, may be pregnant, or are planning to become pregnant.  If you drink alcohol: ? Limit how much you use to 0-1 drink a day. ? Limit intake if you are breastfeeding.  Be aware of how much alcohol is in your drink. In the [place] one drink equals one 12 oz bottle of beer (355 mL), one 5 oz glass of wine (148 mL), or one 1 oz glass of hard liquor (44 mL). General instructions  Schedule regular health, dental, and eye exams.  Stay current with your vaccines.  Tell your health care provider if: ? You often feel depressed. ? You have ever been abused or do not feel safe at home. Summary  Adopting a healthy lifestyle and getting preventive care are important in promoting health and wellness.  Follow your health care provider's instructions about healthy  diet, exercising, and getting tested or screened for diseases.  Follow your health care provider's instructions on monitoring your cholesterol and blood pressure. This information is not intended to replace advice given to you by your health care provider. Make sure you discuss any questions you have with your health care provider. Document Revised: 10/13/2018 Document Reviewed: 10/13/2018 Elsevier Patient Education  2020 [employer]  

## 2019-11-15 ENCOUNTER — Encounter: Payer: Self-pay | Admitting: Family Medicine

## 2019-11-15 ENCOUNTER — Telehealth: Payer: Self-pay | Admitting: Family Medicine

## 2019-11-15 DIAGNOSIS — E1165 Type 2 diabetes mellitus with hyperglycemia: Secondary | ICD-10-CM

## 2019-11-15 MED ORDER — BLOOD GLUCOSE MONITOR KIT: PACK | 0 refills | Status: DC

## 2019-11-15 NOTE — Telephone Encounter (Signed)
Thanks for ordering the glucometer.

## 2019-12-14 ENCOUNTER — Other Ambulatory Visit: Payer: Self-pay | Admitting: Family Medicine

## 2019-12-23 DIAGNOSIS — M25512 Pain in left shoulder: Secondary | ICD-10-CM | POA: Insufficient documentation

## 2020-02-08 ENCOUNTER — Other Ambulatory Visit: Payer: Self-pay | Admitting: Nurse Practitioner

## 2020-02-08 DIAGNOSIS — E785 Hyperlipidemia, unspecified: Secondary | ICD-10-CM

## 2020-02-08 DIAGNOSIS — E1165 Type 2 diabetes mellitus with hyperglycemia: Secondary | ICD-10-CM

## 2020-03-01 LAB — HM DIABETES EYE EXAM

## 2020-06-19 ENCOUNTER — Ambulatory Visit
Admission: EM | Admit: 2020-06-19 | Discharge: 2020-06-19 | Disposition: A | Discharge status: Home / Self Care | Payer: BC Managed Care – PPO | Attending: Family Medicine | Admitting: Family Medicine

## 2020-06-19 ENCOUNTER — Other Ambulatory Visit: Payer: Self-pay

## 2020-06-19 DIAGNOSIS — R0981 Nasal congestion: Secondary | ICD-10-CM

## 2020-06-19 DIAGNOSIS — R059 Cough, unspecified: Secondary | ICD-10-CM

## 2020-06-19 DIAGNOSIS — Z1152 Encounter for screening for COVID-19: Secondary | ICD-10-CM | POA: Diagnosis not present

## 2020-06-19 DIAGNOSIS — R05 Cough: Secondary | ICD-10-CM | POA: Diagnosis not present

## 2020-06-19 DIAGNOSIS — B349 Viral infection, unspecified: Secondary | ICD-10-CM | POA: Diagnosis not present

## 2020-06-19 DIAGNOSIS — R6883 Chills (without fever): Secondary | ICD-10-CM

## 2020-06-19 DIAGNOSIS — R509 Fever, unspecified: Secondary | ICD-10-CM

## 2020-06-19 NOTE — ED Triage Notes (Signed)
Patient complains of chills, cough, chest congestion, and fever x2 days. Denies ShOB or chest pain.

## 2020-06-19 NOTE — Discharge Instructions (Signed)
Your COVID test is pending.  You should self quarantine until the test result is back.    Take Tylenol as needed for fever or discomfort.  Rest and keep yourself hydrated.    Go to the emergency department if you develop acute worsening symptoms.     

## 2020-06-19 NOTE — ED Provider Notes (Signed)
[facility]   [id number] 06/19/20 Arrival Time: 9379   CC: COVID symptoms  SUBJECTIVE: History from: patient.  Elizabeth Dalton is a 49 y.o. female who presents with abrupt onset of nasal congestion, PND, fever, chills and persistent dry cough for 2 days. Denies sick exposure to COVID, flu or strep. Denies recent travel. Has negative history of Covid. Has completed Covid vaccines. Has taken OTC medications for this with little relief. There are no aggravating or alleviating factors. Denies previous symptoms in the past. Denies sinus pain, rhinorrhea, sore throat, SOB, wheezing, chest pain, nausea, changes in bowel or bladder habits.    ROS: As per HPI.  All other pertinent ROS negative.     Past Medical History:  Diagnosis Date  . Allergy    Past Surgical History:  Procedure Laterality Date  . CHOLECYSTECTOMY     Allergies  Allergen Reactions  . Latex Rash   No current facility-administered medications on file prior to encounter.   Current Outpatient Medications on File Prior to Encounter  Medication Sig Dispense Refill  . blood glucose meter kit and supplies KIT Dispense based on patient and insurance preference. Use up to four times daily as directed. (FOR ICD-9 250.00, 250.01). 1 each 0  . cetirizine (ZYRTEC) 10 MG tablet Take by mouth.    . meloxicam (MOBIC) 15 MG tablet Take 15 mg by mouth daily.    . metFORMIN (GLUCOPHAGE) 500 MG tablet Take 1 tablet (500 mg total) by mouth 2 (two) times daily with a meal. 180 tablet 1  . rosuvastatin (CRESTOR) 10 MG tablet TAKE 1 TABLET(10 MG) BY MOUTH DAILY 90 tablet 1   Social History   Socioeconomic History  . Marital status: Married    Spouse name: Not on file  . Number of children: Not on file  . Years of education: Not on file  . Highest education level: Bachelor's degree (e.g., BA, AB, BS)  Occupational History  . Occupation: Psychologist, counselling: [employer]    Comment:  Science  Tobacco Use  . Smoking status: Never Smoker  . Smokeless tobacco: Never Used  Vaping Use  . Vaping Use: Never used  Substance and Sexual Activity  . Alcohol use: Yes    Alcohol/week: 1.0 standard drink    Types: 1 Standard drinks or equivalent per week  . Drug use: Never  . Sexual activity: Yes  Other Topics Concern  . Not on file  Social History Narrative  . Not on file   Social Determinants of Health   Financial Resource Strain:   . Difficulty of Paying Living Expenses:   Food Insecurity:   . Worried About Charity fundraiser in the Last Year:   . Arboriculturist in the Last Year:   Transportation Needs:   . Film/video editor (Medical):   Marland Kitchen Lack of Transportation (Non-Medical):   Physical Activity:   . Days of Exercise per Week:   . Minutes of Exercise per Session:   Stress:   . Feeling of Stress :   Social Connections:   . Frequency of Communication with Friends and Family:   . Frequency of Social Gatherings with Friends and Family:   . Attends Religious Services:   . Active Member of Clubs or Organizations:   . Attends Archivist Meetings:   Marland Kitchen Marital Status:   Intimate Partner Violence:   . Fear of Current or Ex-Partner:   . Emotionally Abused:   .  Physically Abused:   . Sexually Abused:    Family History  Problem Relation Age of Onset  . Lung cancer Mother   . Heart disease Mother   . Hypertension Mother   . Diabetes Mother        T1DM  . Heart disease Father   . Hypertension Father   . Diabetes Sister   . Diabetes Sister   . Dementia Maternal Grandmother   . Liver disease Paternal Grandfather   . Alcohol abuse Paternal Grandfather   . Breast cancer Neg Hx     OBJECTIVE:  Vitals:   06/19/20 0834  BP: (!) 147/88  Pulse: 68  Resp: 16  Temp: 98.2 F (36.8 C)  SpO2: 98%     General appearance: alert; appears fatigued, but nontoxic; speaking in full sentences and tolerating own secretions HEENT: NCAT; Ears: EACs  clear, TMs pearly gray; Eyes: PERRL.  EOM grossly intact. Sinuses: nontender; Nose: nares patent without rhinorrhea, Throat: oropharynx clear, tonsils non erythematous or enlarged, uvula midline  Neck: supple without LAD Lungs: unlabored respirations, symmetrical air entry; cough: mild; no respiratory distress; CTAB Heart: regular rate and rhythm.  Radial pulses 2+ symmetrical bilaterally Skin: warm and dry Psychological: alert and cooperative; normal mood and affect  LABS:  No results found for this or any previous visit (from the past 24 hour(s)).   ASSESSMENT & PLAN:  1. Viral illness   2. Encounter for screening for COVID-19   3. Nasal congestion   4. Cough   5. Fever, unspecified fever cause   6. Chills      COVID testing ordered.  It will take between 1-2 days for test results.  Someone will contact you regarding abnormal results.   Work note provided Patient should remain in quarantine until they have received Covid results.  If negative you may resume normal activities (go back to work/school) while practicing hand hygiene, social distance, and mask wearing.  If positive, patient should remain in quarantine for 10 days from symptom onset AND greater than 72 hours after symptoms resolution (absence of fever without the use of fever-reducing medication and improvement in respiratory symptoms), whichever is longer Get plenty of rest and push fluids Use OTC zyrtec for nasal congestion, runny nose, and/or sore throat Use OTC flonase for nasal congestion and runny nose Use medications daily for symptom relief Use OTC medications like ibuprofen or tylenol as needed fever or pain Call or go to the ED if you have any new or worsening symptoms such as fever, worsening cough, shortness of breath, chest tightness, chest pain, turning blue, changes in mental status.  Reviewed expectations re: course of current medical issues. Questions answered. Outlined signs and symptoms indicating need  for more acute intervention. Patient verbalized understanding. After Visit Summary given.         Faustino Congress, NP 06/19/20 [phone]

## 2020-06-21 LAB — SARS-COV-2, NAA 2 DAY TAT

## 2020-06-21 LAB — NOVEL CORONAVIRUS, NAA: SARS-CoV-2, NAA: NOT DETECTED

## 2020-06-29 ENCOUNTER — Other Ambulatory Visit: Payer: Self-pay | Admitting: Nurse Practitioner

## 2020-06-29 DIAGNOSIS — E1165 Type 2 diabetes mellitus with hyperglycemia: Secondary | ICD-10-CM

## 2020-06-29 NOTE — Telephone Encounter (Signed)
Attempted to call patient to scheduled follow up appointment- left message to call office for appointment. Courtesy RF # [phone] given

## 2020-08-04 ENCOUNTER — Other Ambulatory Visit: Payer: Self-pay | Admitting: Family Medicine

## 2020-08-04 DIAGNOSIS — E1165 Type 2 diabetes mellitus with hyperglycemia: Secondary | ICD-10-CM

## 2020-08-04 NOTE — Telephone Encounter (Signed)
Requested medication (s) are due for refill today: yes  Requested medication (s) are on the active medication list: yes  Last refill:  06/29/20  Future visit scheduled: no  Notes to clinic:  Called pt and LM on VM to call office to schedule appt/overdue labs   Requested Prescriptions  Pending Prescriptions Disp Refills   metFORMIN (GLUCOPHAGE) 500 MG tablet [Pharmacy Med Name: METFORMIN 500MG TABLETS] 60 tablet 0    Sig: TAKE 1 TABLET(500 MG) BY MOUTH TWICE DAILY WITH MEALS      Endocrinology:  Diabetes - Biguanides Failed - 08/04/2020  8:54 AM      Failed - HBA1C is between 0 and 7.9 and within 180 days    Hemoglobin A1C  Date Value Ref Range Status  08/05/2019 6.6 (A) 4.0 - 5.6 % Final   Hgb A1c MFr Bld  Date Value Ref Range Status  01/25/2019 6.7 (H) <5.7 % of total Hgb Final    Comment:    For someone without known diabetes, a hemoglobin A1c value of 6.5% or greater indicates that they may have  diabetes and this should be confirmed with a follow-up  test. . For someone with known diabetes, a value <7% indicates  that their diabetes is well controlled and a value  greater than or equal to 7% indicates suboptimal  control. A1c targets should be individualized based on  duration of diabetes, age, comorbid conditions, and  other considerations. . Currently, no consensus exists regarding use of hemoglobin A1c for diagnosis of diabetes for children. .           Failed - Valid encounter within last 6 months    Recent Outpatient Visits           8 months ago Annual physical exam   [facility] Carles Collet M, [place]   9 months ago Acute pain of left shoulder   [facility] [place], Devonne Doughty, DO   1 year ago Type 2 diabetes mellitus with hyperglycemia, without long-term current use of insulin [facility])   [facility], Jerrel Ivory, NP   1 year ago Type 2 diabetes mellitus with hyperglycemia, without long-term  current use of insulin [facility])   [facility] Olin Hauser, DO   1 year ago Prediabetes   [facility] Merrilyn Puma, Jerrel Ivory, NP              Passed - Cr in normal range and within 360 days    Creat  Date Value Ref Range Status  11/01/2019 0.75 0.50 - 1.10 mg/dL Final          Passed - eGFR in normal range and within 360 days    GFR, Est African American  Date Value Ref Range Status  11/01/2019 109 > OR = 60 mL/min/1.80m Final   GFR, Est Non African American  Date Value Ref Range Status  11/01/2019 94 > OR = 60 mL/min/1.727mFinal

## 2020-08-16 ENCOUNTER — Other Ambulatory Visit: Payer: Self-pay | Admitting: Family Medicine

## 2020-08-16 DIAGNOSIS — E1165 Type 2 diabetes mellitus with hyperglycemia: Secondary | ICD-10-CM

## 2020-08-16 DIAGNOSIS — E785 Hyperlipidemia, unspecified: Secondary | ICD-10-CM

## 2020-08-30 ENCOUNTER — Encounter: Payer: Self-pay | Admitting: Family Medicine

## 2020-08-30 ENCOUNTER — Other Ambulatory Visit: Payer: Self-pay

## 2020-08-30 ENCOUNTER — Ambulatory Visit (INDEPENDENT_AMBULATORY_CARE_PROVIDER_SITE_OTHER): Payer: BC Managed Care – PPO | Admitting: Family Medicine

## 2020-08-30 VITALS — BP 142/89 | HR 69 | Temp 98.3°F | Resp 17 | Ht 67.0 in | Wt 235.6 lb

## 2020-08-30 DIAGNOSIS — R634 Abnormal weight loss: Secondary | ICD-10-CM | POA: Diagnosis not present

## 2020-08-30 DIAGNOSIS — E1165 Type 2 diabetes mellitus with hyperglycemia: Secondary | ICD-10-CM | POA: Diagnosis not present

## 2020-08-30 DIAGNOSIS — E785 Hyperlipidemia, unspecified: Secondary | ICD-10-CM | POA: Diagnosis not present

## 2020-08-30 DIAGNOSIS — E1169 Type 2 diabetes mellitus with other specified complication: Secondary | ICD-10-CM | POA: Insufficient documentation

## 2020-08-30 LAB — POCT GLYCOSYLATED HEMOGLOBIN (HGB A1C): Hemoglobin A1C: 7 % — AB (ref 4.0–5.6)

## 2020-08-30 MED ORDER — METFORMIN HCL 500 MG PO TABS: ORAL_TABLET | ORAL | 3 refills | Status: DC

## 2020-08-30 MED ORDER — ROSUVASTATIN CALCIUM 10 MG PO TABS: ORAL_TABLET | ORAL | 3 refills | Status: DC

## 2020-08-30 NOTE — Progress Notes (Signed)
Subjective:    Patient ID: Elizabeth Dalton, female    DOB: [birth year], 49 y.o.   MRN: [id number]  Elizabeth Dalton is a 49 y.o. female presenting on 08/30/2020 for Diabetes   HPI   Diabetes Pt presents today for follow up Type 2 Diabetes Mellitus.  He/she (caps): She ACTION; IS/IS NOT: is not checking AM CBG at home. -Current diabetic medications include: metformin 500mg  BID WC -ACTION; IS/IS NOT: is not currently symptomatic -Actions; denies/reports/admits to: denies polydipsia, polyphagia, polyuria, headaches, diaphoresis, shakiness, chills, pain, numbness or tingling in extremities or changes in vision -Clinical course has been stable -Reports no structured exercise routine -Diet is moderate in salt, moderate in fat, and moderate in carbohydrates  PREVENTION Eye exam current (within 1 year) Up to date Foot exam current (within 1 year) Due Lipid/ASCVD risk reduction - on statin: YES/NO: Yes  Kidney Protection (On ACE/ARB)? YES/NO: No    Depression screen [facility] 2/9 08/30/2020 05/03/2019 01/21/2019  Decreased Interest 0 0 0  Down, Depressed, Hopeless 0 0 0  PHQ - 2 Score 0 0 0    Social History   Tobacco Use  . Smoking status: Never Smoker  . Smokeless tobacco: Never Used  Vaping Use  . Vaping Use: Never used  Substance Use Topics  . Alcohol use: Yes    Alcohol/week: 1.0 standard drink    Types: 1 Standard drinks or equivalent per week    Comment: occasionally   . Drug use: Never    Review of Systems  Constitutional: Negative.   HENT: Negative.   Eyes: Negative.   Respiratory: Negative.   Cardiovascular: Negative.   Gastrointestinal: Negative.   Endocrine: Negative.   Genitourinary: Negative.   Musculoskeletal: Negative.   Skin: Negative.   Allergic/Immunologic: Negative.   Neurological: Negative.   Hematological: Negative.   Psychiatric/Behavioral: Negative.    Per HPI unless specifically indicated above     Objective:    BP  (!) 142/89 (BP Location: Right Arm, Patient Position: Sitting, Cuff Size: Large)   Pulse 69   Temp 98.3 F (36.8 C) (Oral)   Resp 17   Ht 5\' 7"  (1.702 m)   Wt 235 lb 9.6 oz (106.9 kg)   SpO2 100%   BMI 36.90 kg/m   Wt Readings from Last 3 Encounters:  08/30/20 235 lb 9.6 oz (106.9 kg)  08/05/19 243 lb (110.2 kg)  05/03/19 239 lb (108.4 kg)    Physical Exam Vitals and nursing note reviewed.  Constitutional:      General: She is not in acute distress.    Appearance: Normal appearance. She is well-developed and well-groomed. She is obese. She is not ill-appearing or toxic-appearing.  HENT:     Head: Normocephalic and atraumatic.     Nose:     Comments: Lizbeth Bark is in place, covering mouth and nose. Eyes:     General: Lids are normal. Vision grossly intact.        Right eye: No discharge.        Left eye: No discharge.     Extraocular Movements: Extraocular movements intact.     Conjunctiva/sclera: Conjunctivae normal.     Pupils: Pupils are equal, round, and reactive to light.  Cardiovascular:     Rate and Rhythm: Normal rate and regular rhythm.     Pulses: Normal pulses.     Heart sounds: Normal heart sounds. No murmur heard.  No friction rub. No gallop.   Pulmonary:  Effort: Pulmonary effort is normal. No respiratory distress.     Breath sounds: Normal breath sounds.  Musculoskeletal:     Right lower leg: No edema.     Left lower leg: No edema.  Skin:    General: Skin is warm and dry.     Capillary Refill: Capillary refill takes less than 2 seconds.  Neurological:     General: No focal deficit present.     Mental Status: She is alert and oriented to person, place, and time.  Psychiatric:        Attention and Perception: Attention and perception normal.        Mood and Affect: Mood and affect normal.        Speech: Speech normal.        Behavior: Behavior normal. Behavior is cooperative.        Thought Content: Thought content normal.        Cognition and  Memory: Cognition and memory normal.        Judgment: Judgment normal.    Results for orders placed or performed in visit on 08/30/20  POCT glycosylated hemoglobin (Hb A1C)  Result Value Ref Range   Hemoglobin A1C 7.0 (A) 4.0 - 5.6 %   HbA1c POC (<> result, manual entry)     HbA1c, POC (prediabetic range)     HbA1c, POC (controlled diabetic range)        Assessment & Plan:   Problem List Items Addressed This Visit      Endocrine   Type 2 diabetes mellitus with hyperglycemia ([place]) - Primary    ControlledDM with A1c 7.0% Worsening control from 6.6% on 08/05/2019 and goal K0X < 3.8%. -Complications - hyperglycemia.  Plan:  1. Continue current therapy: metformin 500mg  BID WC 2. Encourage improved lifestyle: - low carb/low glycemic diet reinforced prior education - Increase physical activity to 30 minutes most days of the week.  Explained that increased physical activity increases body's use of sugar for energy. 3. Check fasting am CBG and log once a week.  Bring log to next visit for review 4. Continue Statin 5. Due for diabetic foot exam at next visit.   6. Follow-up 6 months       Relevant Medications   metFORMIN (GLUCOPHAGE) 500 MG tablet   rosuvastatin (CRESTOR) 10 MG tablet   Other Relevant Orders   POCT glycosylated hemoglobin (Hb A1C) (Completed)   CBC with Differential   COMPLETE METABOLIC PANEL WITH GFR     Other   Hyperlipidemia LDL goal <70    Status unknown.  Recheck labs.  Continue meds without changes today.  Refills provided. Followup after labs.       Relevant Medications   rosuvastatin (CRESTOR) 10 MG tablet   Other Relevant Orders   Lipid Profile    Other Visit Diagnoses    Weight loss       Relevant Orders   TSH + free T4      Meds ordered this encounter  Medications  . metFORMIN (GLUCOPHAGE) 500 MG tablet    Sig: TAKE 1 TABLET(500 MG) BY MOUTH TWICE DAILY WITH MEALS    Dispense:  180 tablet    Refill:  3  . rosuvastatin (CRESTOR) 10 MG  tablet    Sig: TAKE 1 TABLET(10 MG) BY MOUTH DAILY    Dispense:  90 tablet    Refill:  3    Follow up plan: Return in about 6 months (around 02/28/2021) for T2DM, A1C F/U, PAP.  Harlin Rain, [place] [facility] Nurse Practitioner [facility] 08/30/2020, 11:25 AM

## 2020-08-30 NOTE — Assessment & Plan Note (Signed)
Status unknown.  Recheck labs.  Continue meds without changes today.  Refills provided. Followup after labs.  

## 2020-08-30 NOTE — Assessment & Plan Note (Signed)
ControlledDM with A1c 7.0% Worsening control from 6.6% on 08/05/2019 and goal X9B < 7.1%. -Complications - hyperglycemia.  Plan:  1. Continue current therapy: metformin 500mg  BID WC 2. Encourage improved lifestyle: - low carb/low glycemic diet reinforced prior education - Increase physical activity to 30 minutes most days of the week.  Explained that increased physical activity increases body's use of sugar for energy. 3. Check fasting am CBG and log once a week.  Bring log to next visit for review 4. Continue Statin 5. Due for diabetic foot exam at next visit.   6. Follow-up 6 months

## 2020-08-30 NOTE — Patient Instructions (Signed)
Have your labs drawn and we will contact you with the results.  You can learn more information online about your diabetes at American Diabetes Association: http://www.diabetes.org/ - General self-care (diet, medications, blood sugar checks). - Diet recommendations - There are even recipes available for you to look at and try.  Continue all medications as directed  We will plan to see you back in 6 months for diabetes follow up and PAP testing  You will receive a survey after today's visit either digitally by e-mail or paper by [place] mail. Your experiences and feedback matter to [place].  Please respond so we know how we are doing as we provide care for you.  Call us with any questions/concerns/needs.  It is my goal to be available to you for your health concerns.  Thanks for choosing me to be a partner in your healthcare needs!  Harlin Rain, FNP-C Family Nurse Practitioner [facility] Phone: [phone]

## 2020-08-31 LAB — CBC WITH DIFFERENTIAL/PLATELET
Absolute Monocytes: 418 cells/uL (ref 200–950)
Basophils Absolute: 41 cells/uL (ref 0–200)
Basophils Relative: 0.5 %
Eosinophils Absolute: 98 cells/uL (ref 15–500)
Eosinophils Relative: 1.2 %
HCT: 46.5 % — ABNORMAL HIGH (ref 35.0–45.0)
Hemoglobin: 15 g/dL (ref 11.7–15.5)
Lymphs Abs: 2936 cells/uL (ref 850–3900)
MCH: 27.6 pg (ref 27.0–33.0)
MCHC: 32.3 g/dL (ref 32.0–36.0)
MCV: 85.6 fL (ref 80.0–100.0)
MPV: 10.2 fL (ref 7.5–12.5)
Monocytes Relative: 5.1 %
Neutro Abs: 4707 cells/uL (ref 1500–7800)
Neutrophils Relative %: 57.4 %
Platelets: 242 10*3/uL (ref 140–400)
RBC: 5.43 10*6/uL — ABNORMAL HIGH (ref 3.80–5.10)
RDW: 12.5 % (ref 11.0–15.0)
Total Lymphocyte: 35.8 %
WBC: 8.2 10*3/uL (ref 3.8–10.8)

## 2020-08-31 LAB — COMPLETE METABOLIC PANEL WITH GFR
AG Ratio: 1.5 (calc) (ref 1.0–2.5)
ALT: 52 U/L — ABNORMAL HIGH (ref 6–29)
AST: 34 U/L (ref 10–35)
Albumin: 4.5 g/dL (ref 3.6–5.1)
Alkaline phosphatase (APISO): 73 U/L (ref 31–125)
BUN: 10 mg/dL (ref 7–25)
CO2: 28 mmol/L (ref 20–32)
Calcium: 9.8 mg/dL (ref 8.6–10.2)
Chloride: 102 mmol/L (ref 98–110)
Creat: 0.78 mg/dL (ref 0.50–1.10)
GFR, Est African American: 103 mL/min/{1.73_m2} (ref >=60)
GFR, Est Non African American: 89 mL/min/{1.73_m2} (ref >=60)
Globulin: 3 g/dL (calc) (ref 1.9–3.7)
Glucose, Bld: 144 mg/dL — ABNORMAL HIGH (ref 65–99)
Potassium: 4.1 mmol/L (ref 3.5–5.3)
Sodium: 139 mmol/L (ref 135–146)
Total Bilirubin: 0.8 mg/dL (ref 0.2–1.2)
Total Protein: 7.5 g/dL (ref 6.1–8.1)

## 2020-08-31 LAB — LIPID PANEL
Cholesterol: 132 mg/dL (ref <200)
HDL: 52 mg/dL (ref >=50)
LDL Cholesterol (Calc): 60 mg/dL (calc)
Non-HDL Cholesterol (Calc): 80 mg/dL (calc) (ref <130)
Total CHOL/HDL Ratio: 2.5 (calc) (ref <5.0)
Triglycerides: 117 mg/dL (ref <150)

## 2020-08-31 LAB — TSH+FREE T4: TSH W/REFLEX TO FT4: 1.59 mIU/L

## 2020-11-21 ENCOUNTER — Other Ambulatory Visit: Payer: Self-pay

## 2020-11-21 DIAGNOSIS — Z20822 Contact with and (suspected) exposure to covid-19: Secondary | ICD-10-CM

## 2020-11-23 LAB — SARS-COV-2, NAA 2 DAY TAT

## 2020-11-23 LAB — NOVEL CORONAVIRUS, NAA: SARS-CoV-2, NAA: NOT DETECTED

## 2021-02-07 ENCOUNTER — Other Ambulatory Visit (HOSPITAL_COMMUNITY)
Admission: RE | Admit: 2021-02-07 | Discharge: 2021-02-07 | Disposition: A | Discharge status: Home / Self Care | Payer: BC Managed Care – PPO | Source: Ambulatory Visit | Attending: Physician Assistant | Admitting: Physician Assistant

## 2021-02-07 ENCOUNTER — Other Ambulatory Visit: Payer: Self-pay

## 2021-02-07 ENCOUNTER — Ambulatory Visit (INDEPENDENT_AMBULATORY_CARE_PROVIDER_SITE_OTHER): Payer: BC Managed Care – PPO | Admitting: Physician Assistant

## 2021-02-07 ENCOUNTER — Telehealth: Payer: Self-pay

## 2021-02-07 ENCOUNTER — Ambulatory Visit
Admission: RE | Admit: 2021-02-07 | Discharge: 2021-02-07 | Disposition: A | Discharge status: Home / Self Care | Payer: BC Managed Care – PPO | Source: Ambulatory Visit | Attending: Physician Assistant | Admitting: Physician Assistant

## 2021-02-07 ENCOUNTER — Encounter: Payer: Self-pay | Admitting: Physician Assistant

## 2021-02-07 VITALS — BP 159/76 | HR 66 | Temp 97.5°F | Ht 67.0 in | Wt 238.6 lb

## 2021-02-07 DIAGNOSIS — Z0000 Encounter for general adult medical examination without abnormal findings: Secondary | ICD-10-CM | POA: Diagnosis not present

## 2021-02-07 DIAGNOSIS — Z1231 Encounter for screening mammogram for malignant neoplasm of breast: Secondary | ICD-10-CM | POA: Diagnosis present

## 2021-02-07 DIAGNOSIS — E1165 Type 2 diabetes mellitus with hyperglycemia: Secondary | ICD-10-CM

## 2021-02-07 DIAGNOSIS — Z1211 Encounter for screening for malignant neoplasm of colon: Secondary | ICD-10-CM

## 2021-02-07 DIAGNOSIS — I10 Essential (primary) hypertension: Secondary | ICD-10-CM | POA: Diagnosis not present

## 2021-02-07 DIAGNOSIS — Z124 Encounter for screening for malignant neoplasm of cervix: Secondary | ICD-10-CM

## 2021-02-07 DIAGNOSIS — Z23 Encounter for immunization: Secondary | ICD-10-CM

## 2021-02-07 DIAGNOSIS — E785 Hyperlipidemia, unspecified: Secondary | ICD-10-CM | POA: Diagnosis not present

## 2021-02-07 DIAGNOSIS — Z6838 Body mass index (BMI) 38.0-38.9, adult: Secondary | ICD-10-CM

## 2021-02-07 LAB — POCT GLYCOSYLATED HEMOGLOBIN (HGB A1C): Hemoglobin A1C: 7.3 % — AB (ref 4.0–5.6)

## 2021-02-07 MED ORDER — LISINOPRIL 10 MG PO TABS: 10.0000 mg | ORAL_TABLET | Freq: Every day | ORAL | 0 refills | Status: DC

## 2021-02-07 NOTE — Progress Notes (Signed)
Complete physical exam   Patient: Elizabeth Dalton   DOB: [birth year]   50 y.o. Female  MRN: [id number] Visit Date: 02/07/2021  Today's healthcare provider: Trinna Post, PA-C   Chief Complaint  Patient presents with  . Diabetes  . Annual Exam   Subjective    Elizabeth Dalton is a 50 y.o. female who presents today for a complete physical exam.  She reports consuming a general diet. Walking for exercise.  She generally feels well. She reports sleeping well. She does have additional problems to discuss today.  HPI   Diabetes Mellitus Type II, Follow-up  Lab Results  Component Value Date   HGBA1C 7.3 (A) 02/07/2021   HGBA1C 7.0 (A) 08/30/2020   HGBA1C 6.6 (A) 08/05/2019   Wt Readings from Last 3 Encounters:  02/07/21 238 lb 9.6 oz (108.2 kg)  08/30/20 235 lb 9.6 oz (106.9 kg)  08/05/19 243 lb (110.2 kg)   Last seen for diabetes 4 months ago.  Management since then includes continue metformin 500 mg BID. She reports good compliance with treatment. She is not having side effects.  Symptoms: No fatigue No foot ulcerations  No appetite changes No nausea  No paresthesia of the feet  No polydipsia  No polyuria No visual disturbances   No vomiting     Home blood sugar records: not checked  Episodes of hypoglycemia? No    Current insulin regiment: none Most Recent Eye Exam: [place] Exam 02/2020 Current exercise: aerobics Current diet habits: well balanced  Pertinent Labs: Lab Results  Component Value Date   CHOL 132 08/30/2020   HDL 52 08/30/2020   LDLCALC 60 08/30/2020   TRIG 117 08/30/2020   CHOLHDL 2.5 08/30/2020   Lab Results  Component Value Date   NA 139 08/30/2020   K 4.1 08/30/2020   CREATININE 0.78 08/30/2020   GFRNONAA 89 08/30/2020   GFRAA 103 08/30/2020   GLUCOSE 144 (H) 08/30/2020     ---------------------------------------------------------------------------------------------------  Lipid/Cholesterol,  Follow-up  Last lipid panel Other pertinent labs  Lab Results  Component Value Date   CHOL 132 08/30/2020   HDL 52 08/30/2020   LDLCALC 60 08/30/2020   TRIG 117 08/30/2020   CHOLHDL 2.5 08/30/2020   Lab Results  Component Value Date   ALT 52 (H) 08/30/2020   AST 34 08/30/2020   PLT 242 08/30/2020   TSH 2.58 11/01/2019     She was last seen for this 6 months ago.  Management since that visit includes continue crestor 10 mg nightly.  She reports excellent compliance with treatment. She is not having side effects.   Symptoms: No chest pain No chest pressure/discomfort  No dyspnea No lower extremity edema  No numbness or tingling of extremity No orthopnea  No palpitations No paroxysmal nocturnal dyspnea  No speech difficulty No syncope   Current diet: well balanced Current exercise: aerobics  The 10-year ASCVD risk score Mikey Bussing DC Jr., et al., 2013) is: 3%  --------------------------------------------------------------------------------------------------- Hypertension, follow-up  BP Readings from Last 3 Encounters:  02/07/21 (!) 159/76  08/30/20 (!) 142/89  06/19/20 (!) 147/88   Wt Readings from Last 3 Encounters:  02/07/21 238 lb 9.6 oz (108.2 kg)  08/30/20 235 lb 9.6 oz (106.9 kg)  08/05/19 243 lb (110.2 kg)     Symptoms: No chest pain No chest pressure  No palpitations No syncope  No dyspnea No orthopnea  No paroxysmal nocturnal dyspnea No lower extremity edema   Pertinent  labs: Lab Results  Component Value Date   CHOL 132 08/30/2020   HDL 52 08/30/2020   LDLCALC 60 08/30/2020   TRIG 117 08/30/2020   CHOLHDL 2.5 08/30/2020   Lab Results  Component Value Date   NA 139 08/30/2020   K 4.1 08/30/2020   CREATININE 0.78 08/30/2020   GFRNONAA 89 08/30/2020   GFRAA 103 08/30/2020   GLUCOSE 144 (H) 08/30/2020     The 10-year ASCVD risk score Mikey Bussing DC Jr., et al., 2013) is: 3%    ---------------------------------------------------------------------------------------------------  BP Readings from Last 3 Encounters:  02/07/21 (!) 159/76  08/30/20 (!) 142/89  06/19/20 (!) 147/88    Past Medical History:  Diagnosis Date  . Allergy    Past Surgical History:  Procedure Laterality Date  . CHOLECYSTECTOMY     Social History   Socioeconomic History  . Marital status: Married    Spouse name: Not on file  . Number of children: Not on file  . Years of education: Not on file  . Highest education level: Bachelor's degree (e.g., BA, AB, BS)  Occupational History  . Occupation: Psychologist, counselling: [employer]    Comment: Science  Tobacco Use  . Smoking status: Never Smoker  . Smokeless tobacco: Never Used  Vaping Use  . Vaping Use: Never used  Substance and Sexual Activity  . Alcohol use: Yes    Alcohol/week: 1.0 standard drink    Types: 1 Standard drinks or equivalent per week    Comment: occasionally   . Drug use: Never  . Sexual activity: Yes  Other Topics Concern  . Not on file  Social History Narrative  . Not on file   Social Determinants of Health   Financial Resource Strain: Not on file  Food Insecurity: Not on file  Transportation Needs: Not on file  Physical Activity: Not on file  Stress: Not on file  Social Connections: Not on file  Intimate Partner Violence: Not on file   Family Status  Relation Name Status  . Mother  Deceased at age 73  . Father  Alive  . Sister  Alive  . Sister  Alive  . Son  Aloha Gell, age 33y  . MGM  Deceased  . MGF  Deceased       Traumatic car accident  . PGM  Deceased at age 83  . PGF  Deceased  . Sister  Alive  . Neg Hx  (Not Specified)   Family History  Problem Relation Age of Onset  . Lung cancer Mother   . Heart disease Mother   . Hypertension Mother   . Diabetes Mother        T1DM  . Heart disease Father   . Hypertension Father   . Diabetes Sister   .  Diabetes Sister   . Dementia Maternal Grandmother   . Liver disease Paternal Grandfather   . Alcohol abuse Paternal Grandfather   . Breast cancer Neg Hx    Allergies  Allergen Reactions  . Latex Rash    Patient Care Team: Malfi, Lupita Raider, FNP as PCP - General (Family Medicine)   Medications: Outpatient Medications Prior to Visit  Medication Sig  . blood glucose meter kit and supplies KIT Dispense based on patient and insurance preference. Use up to four times daily as directed. (FOR ICD-9 250.00, 250.01).  . cetirizine (ZYRTEC) 10 MG tablet Take by mouth.  . metFORMIN (GLUCOPHAGE) 500 MG tablet TAKE 1 TABLET(500 MG) BY  MOUTH TWICE DAILY WITH MEALS  . MULTIPLE VITAMINS PO Take by mouth.  . rosuvastatin (CRESTOR) 10 MG tablet TAKE 1 TABLET(10 MG) BY MOUTH DAILY  . meloxicam (MOBIC) 15 MG tablet Take 15 mg by mouth daily. (Patient not taking: No sig reported)   No facility-administered medications prior to visit.    Review of Systems     Objective    BP (!) 159/76 (BP Location: Left Arm, Patient Position: Sitting, Cuff Size: Normal)   Pulse 66   Temp (!) 97.5 F (36.4 C) (Temporal)   Ht _0  (1.702 m)   Wt 238 lb 9.6 oz (108.2 kg)   SpO2 100%   BMI 37.37 kg/m    Physical Exam Constitutional:      Appearance: Normal appearance.  HENT:     Right Ear: Tympanic membrane and ear canal normal.     Left Ear: Tympanic membrane and ear canal normal.  Cardiovascular:     Rate and Rhythm: Normal rate and regular rhythm.     Heart sounds: Normal heart sounds.  Pulmonary:     Effort: Pulmonary effort is normal.     Breath sounds: Normal breath sounds.  Abdominal:     General: Bowel sounds are normal.     Palpations: Abdomen is soft.  Genitourinary:    General: Normal vulva.     Vagina: Normal.     Cervix: Normal.  Musculoskeletal:     Cervical back: Normal range of motion and neck supple.  Lymphadenopathy:     Cervical: No cervical adenopathy.  Skin:    General:  Skin is warm and dry.  Neurological:     Mental Status: She is alert and oriented to person, place, and time. Mental status is at baseline.  Psychiatric:        Mood and Affect: Mood normal.        Behavior: Behavior normal.       Last depression screening scores PHQ 2/9 Scores 08/30/2020 05/03/2019 01/21/2019  PHQ - 2 Score 0 0 0   Last fall risk screening Fall Risk  05/03/2019  Falls in the past year? 0  Number falls in past yr: -  Injury with Fall? -  Follow up Falls evaluation completed   Last Audit-C alcohol use screening No flowsheet data found. A score of 3 or more in women, and 4 or more in men indicates increased risk for alcohol abuse, EXCEPT if all of the points are from question 1   Results for orders placed or performed in visit on 02/07/21  POCT glycosylated hemoglobin (Hb A1C)  Result Value Ref Range   Hemoglobin A1C 7.3 (A) 4.0 - 5.6 %   HbA1c POC (<> result, manual entry)     HbA1c, POC (prediabetic range)     HbA1c, POC (controlled diabetic range)      Assessment & Plan    Routine Health Maintenance and Physical Exam  Exercise Activities and Dietary recommendations Goals   None     Immunization History  Administered Date(s) Administered  . Influenza,inj,Quad PF,6+ Mos 10/19/2016, 09/07/2017, 08/05/2019  . Influenza-Unspecified 08/04/2018, 07/17/2020  . Moderna SARS-COV2 Booster Vaccination 08/30/2020  . Moderna Sars-Covid-2 Vaccination 01/01/2020, 01/31/2020  . Td 04/27/2018  . Tdap 04/25/2009    Health Maintenance  Topic Date Due  . Hepatitis C Screening  Never done  . PNEUMOCOCCAL POLYSACCHARIDE VACCINE AGE 9-64 HIGH RISK  Never done  . HIV Screening  Never done  . COLONOSCOPY (Pts 45-3yr Insurance coverage will need  to be confirmed)  Never done  . PAP SMEAR-Modifier  05/18/2020  . MAMMOGRAM  01/06/2021  . OPHTHALMOLOGY EXAM  03/01/2021  . INFLUENZA VACCINE  06/03/2021  . HEMOGLOBIN A1C  08/09/2021  . FOOT EXAM  02/07/2022  .  TETANUS/TDAP  04/27/2028  . COVID-19 Vaccine  Completed  . HPV VACCINES  Aged Out    Discussed health benefits of physical activity, and encouraged her to engage in regular exercise appropriate for her age and condition.  1. Annual physical exam  - MM 3D SCREEN BREAST BILATERAL; Future  2. Type 2 diabetes mellitus with hyperglycemia, without long-term current use of insulin (HCC)  Well controlled with last A1c 7.3% but slightly trending up. She will work on lifestyle changes. Continue current medications UTD on vaccines, eye exam, foot exam. Reminded to schedule eye exam w/ [facility] On ACEi On Statin Discussed diet and exercise F/u in 2 months  - POCT glycosylated hemoglobin (Hb A1C)  3. Hyperlipidemia LDL goal <70  Previously well controlled Continue statin Repeat FLP and CMP Goal LDL < 70   4. Class 2 severe obesity due to excess calories with serious comorbidity and body mass index (BMI) of 38.0 to 38.9 in adult  [facility])  Discussed importance of healthy weight management Discussed diet and exercise   5. Cervical cancer screening  - Cytology - PAP  6. Colon cancer screening  - Ambulatory referral to Gastroenterology  7. Need for vaccination against Streptococcus pneumoniae  - Pneumococcal polysaccharide vaccine 23-valent greater than or equal to 2yo subcutaneous/IM  8. Need for shingles vaccine  Reports she is not varicella immune. Recommended varicella vaccination.   9. Primary hypertension  Start as below, counseled on risks/benefits including angioedema. Follow up 2 months.   - lisinopril (ZESTRIL) 10 MG tablet; Take 1 tablet (10 mg total) by mouth daily.  Dispense: 90 tablet; Refill: [place], PA-C  [facility] [phone] (phone) [phone] (fax)  [place]

## 2021-02-07 NOTE — Telephone Encounter (Signed)
Copied from [place] [phone]. Topic: Appointment Scheduling - Scheduling Inquiry for Clinic >> Feb 06, 2021  4:21 PM Pawlus, Apolonio Schneiders wrote: Reason for CRM: Pt requested a call if there are any open appts or cancellations for tomorrow (4/7)

## 2021-02-07 NOTE — Patient Instructions (Signed)
Remember to schedule eye exam with Dr. Ellin Mayhew

## 2021-02-13 LAB — CYTOLOGY - PAP
Comment: NEGATIVE
Diagnosis: NEGATIVE
High risk HPV: NEGATIVE

## 2021-02-15 ENCOUNTER — Ambulatory Visit: Payer: BC Managed Care – PPO | Admitting: Family Medicine

## 2021-02-15 ENCOUNTER — Ambulatory Visit: Payer: Self-pay | Admitting: Physician Assistant

## 2021-02-22 ENCOUNTER — Telehealth (INDEPENDENT_AMBULATORY_CARE_PROVIDER_SITE_OTHER): Payer: Self-pay | Admitting: Gastroenterology

## 2021-02-22 ENCOUNTER — Other Ambulatory Visit: Payer: Self-pay

## 2021-02-22 DIAGNOSIS — Z1211 Encounter for screening for malignant neoplasm of colon: Secondary | ICD-10-CM

## 2021-02-22 MED ORDER — PEG 3350-KCL-NA BICARB-NACL 420 G PO SOLR: 4000.0000 mL | Freq: Once | ORAL | 0 refills | Status: AC

## 2021-02-22 NOTE — Progress Notes (Signed)
Gastroenterology Pre-Procedure Review  Request Date: 04/19/21 Requesting Physician: Dr. Bonna Gains  PATIENT REVIEW QUESTIONS: The patient responded to the following health history questions as indicated:    1. Are you having any GI issues? no 2. Do you have a personal history of Polyps? no 3. Do you have a family history of Colon Cancer or Polyps? yes (both parents had colon polyps (Precancerous)) 4. Diabetes Mellitus? yes (type 2 oral meds) 5. Joint replacements in the past 12 months?no 6. Major health problems in the past 3 months?no 7. Any artificial heart valves, MVP, or defibrillator?no    MEDICATIONS & ALLERGIES:    Patient reports the following regarding taking any anticoagulation/antiplatelet therapy:   Plavix, Coumadin, Eliquis, Xarelto, Lovenox, Pradaxa, Brilinta, or Effient? no Aspirin? no  Patient confirms/reports the following medications:  Current Outpatient Medications  Medication Sig Dispense Refill  . blood glucose meter kit and supplies KIT Dispense based on patient and insurance preference. Use up to four times daily as directed. (FOR ICD-9 250.00, 250.01). 1 each 0  . cetirizine (ZYRTEC) 10 MG tablet Take by mouth.    Marland Kitchen lisinopril (ZESTRIL) 10 MG tablet Take 1 tablet (10 mg total) by mouth daily. 90 tablet 0  . metFORMIN (GLUCOPHAGE) 500 MG tablet TAKE 1 TABLET(500 MG) BY MOUTH TWICE DAILY WITH MEALS 180 tablet 3  . MULTIPLE VITAMINS PO Take by mouth.    . rosuvastatin (CRESTOR) 10 MG tablet TAKE 1 TABLET(10 MG) BY MOUTH DAILY 90 tablet 3  . meloxicam (MOBIC) 15 MG tablet Take 15 mg by mouth daily. (Patient not taking: No sig reported)     No current facility-administered medications for this visit.    Patient confirms/reports the following allergies:  Allergies  Allergen Reactions  . Latex Rash    No orders of the defined types were placed in this encounter.   AUTHORIZATION INFORMATION Primary Insurance: 1D#: Group #:  Secondary  Insurance: 1D#: Group #:  SCHEDULE INFORMATION: Date: 04/19/21 Time: Location:[facility]

## 2021-03-01 ENCOUNTER — Ambulatory Visit: Payer: Self-pay | Admitting: Internal Medicine

## 2021-03-11 ENCOUNTER — Encounter: Payer: Self-pay | Admitting: Internal Medicine

## 2021-03-11 ENCOUNTER — Ambulatory Visit (INDEPENDENT_AMBULATORY_CARE_PROVIDER_SITE_OTHER): Payer: BC Managed Care – PPO | Admitting: Internal Medicine

## 2021-03-11 ENCOUNTER — Other Ambulatory Visit: Payer: Self-pay

## 2021-03-11 VITALS — BP 158/90 | HR 81 | Wt 236.2 lb

## 2021-03-11 DIAGNOSIS — E1165 Type 2 diabetes mellitus with hyperglycemia: Secondary | ICD-10-CM | POA: Diagnosis not present

## 2021-03-11 DIAGNOSIS — E785 Hyperlipidemia, unspecified: Secondary | ICD-10-CM

## 2021-03-11 DIAGNOSIS — I152 Hypertension secondary to endocrine disorders: Secondary | ICD-10-CM | POA: Insufficient documentation

## 2021-03-11 DIAGNOSIS — Z6836 Body mass index (BMI) 36.0-36.9, adult: Secondary | ICD-10-CM

## 2021-03-11 DIAGNOSIS — I10 Essential (primary) hypertension: Secondary | ICD-10-CM

## 2021-03-11 NOTE — Progress Notes (Signed)
Subjective:    Patient ID: Elizabeth Dalton, female    DOB: October 27, 1971, 50 y.o.   MRN: [id number]  HPI  Pt presents to the clinic today for follow up of chronic conditions. She is establishing care with me today, transferring care from [facility], NP.  HTN: Her BP today is 160/90. She is taking Lisinopril as prescribed. 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 ECG from 05/2012 reviewed.  DM 2: Her last A1C was 7.4%, 02/2021. She is taking Metformin as prescribed. She does not check her sugars. She checks her feet routinely. She is due for her annual eye exam. Flu 07/2020. Pneumovax 02/2021. Covid- Moderna x 3.  HLD: Her last LDL was 60, triglycerides 892, 08/2020. She denies myalgias on Rosuvastatin. She tries to consume a low fat diet.  Review of Systems      Past Medical History:  Diagnosis Date  . Allergy     Current Outpatient Medications  Medication Sig Dispense Refill  . blood glucose meter kit and supplies KIT Dispense based on patient and insurance preference. Use up to four times daily as directed. (FOR ICD-9 250.00, 250.01). 1 each 0  . cetirizine (ZYRTEC) 10 MG tablet Take by mouth.    Marland Kitchen lisinopril (ZESTRIL) 10 MG tablet Take 1 tablet (10 mg total) by mouth daily. 90 tablet 0  . meloxicam (MOBIC) 15 MG tablet Take 15 mg by mouth daily. (Patient not taking: No sig reported)    . metFORMIN (GLUCOPHAGE) 500 MG tablet TAKE 1 TABLET(500 MG) BY MOUTH TWICE DAILY WITH MEALS 180 tablet 3  . MULTIPLE VITAMINS PO Take by mouth.    . rosuvastatin (CRESTOR) 10 MG tablet TAKE 1 TABLET(10 MG) BY MOUTH DAILY 90 tablet 3   No current facility-administered medications for this visit.    Allergies  Allergen Reactions  . Latex Rash    Family History  Problem Relation Age of Onset  . Lung cancer Mother   . Heart disease Mother   . Hypertension Mother   . Diabetes Mother        T1DM  . Heart disease Father    . Hypertension Father   . Diabetes Sister   . Diabetes Sister   . Dementia Maternal Grandmother   . Liver disease Paternal Grandfather   . Alcohol abuse Paternal Grandfather   . Breast cancer Neg Hx     Social History   Socioeconomic History  . Marital status: Married    Spouse name: Not on file  . Number of children: Not on file  . Years of education: Not on file  . Highest education level: Bachelor's degree (e.g., BA, AB, BS)  Occupational History  . 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 Smoking status: Never Smoker  . Smokeless tobacco: Never Used  Vaping Use  . Vaping Use: Never used  Substance and Sexual Activity  . Alcohol use: Yes    Alcohol/week: 1.0 standard drink    Types: 1 Standard drinks or equivalent per week    Comment: occasionally   . Drug use: Never  . Sexual activity: Yes  Other Topics Concern  . Not on file  Social History Narrative  . Not on file   Social Determinants of Health   Financial Resource Strain: Not on file  Food Insecurity: Not on file  Transportation Needs: Not on  file  Physical Activity: Not on file  Stress: Not on file  Social Connections: Not on file  Intimate Partner Violence: Not on file     Constitutional: Denies fever, malaise, fatigue, headache or abrupt weight changes.  Respiratory: Denies difficulty breathing, shortness of breath, cough or sputum production.   Cardiovascular: Denies chest pain, chest tightness, palpitations or swelling in the hands or feet.  Gastrointestinal: Denies abdominal pain, bloating, constipation, diarrhea or blood in the stool.  GU: Denies urgency, frequency, pain with urination, burning sensation, blood in urine, odor or discharge. Musculoskeletal: Denies decrease in range of motion, difficulty with gait, muscle pain or joint pain and swelling.  Skin: Denies redness, rashes, lesions or ulcercations.  Neurological:  Denies dizziness, difficulty with memory, difficulty with speech or problems with balance and coordination.    No other specific complaints in a complete review of systems (except as listed in HPI above).  Objective:   Physical Exam  There were no vitals taken for this visit. Wt Readings from Last 3 Encounters:  02/07/21 238 lb 9.6 oz (108.2 kg)  08/30/20 235 lb 9.6 oz (106.9 kg)  08/05/19 243 lb (110.2 kg)    General: Appears her stated age, obese, in NAD. Skin: Warm, dry and intact. No ulcerations noted. HEENT: Head: normal shape and size; Eyes: sclera white and EOMs intact;  Cardiovascular: Normal rate and rhythm. S1,S2 noted.  No murmur, rubs or gallops noted. No JVD or BLE edema. No carotid bruits noted. Pulmonary/Chest: Normal effort and positive vesicular breath sounds. No respiratory distress. No wheezes, rales or ronchi noted.  Musculoskeletal: No difficulty with gait.  Neurological: Alert and oriented.   BMET    Component Value Date/Time   NA 139 08/30/2020 1023   NA 139 06/02/2012 1926   K 4.1 08/30/2020 1023   K 3.9 06/02/2012 1926   CL 102 08/30/2020 1023   CL 103 06/02/2012 1926   CO2 28 08/30/2020 1023   CO2 26 06/02/2012 1926   GLUCOSE 144 (H) 08/30/2020 1023   GLUCOSE 129 (H) 06/02/2012 1926   BUN 10 08/30/2020 1023   BUN 10 06/02/2012 1926   CREATININE 0.78 08/30/2020 1023   CALCIUM 9.8 08/30/2020 1023   CALCIUM 8.7 06/02/2012 1926   GFRNONAA 89 08/30/2020 1023   GFRAA 103 08/30/2020 1023    Lipid Panel     Component Value Date/Time   CHOL 132 08/30/2020 1023   TRIG 117 08/30/2020 1023   HDL 52 08/30/2020 1023   CHOLHDL 2.5 08/30/2020 1023   LDLCALC 60 08/30/2020 1023    CBC    Component Value Date/Time   WBC 8.2 08/30/2020 1023   RBC 5.43 (H) 08/30/2020 1023   HGB 15.0 08/30/2020 1023   HGB 14.6 06/02/2012 1926   HCT 46.5 (H) 08/30/2020 1023   HCT 42.6 06/02/2012 1926   PLT 242 08/30/2020 1023   PLT 250 06/02/2012 1926   MCV 85.6  08/30/2020 1023   MCV 85 06/02/2012 1926   MCH 27.6 08/30/2020 1023   MCHC 32.3 08/30/2020 1023   RDW 12.5 08/30/2020 1023   RDW 13.5 06/02/2012 1926   LYMPHSABS 2,936 08/30/2020 1023   LYMPHSABS 3.3 05/17/2012 0630   MONOABS 0.6 05/17/2012 0630   EOSABS 98 08/30/2020 1023   EOSABS 0.2 05/17/2012 0630   BASOSABS 41 08/30/2020 1023   BASOSABS 0.0 05/17/2012 0630    Hgb A1C Lab Results  Component Value Date   HGBA1C 7.3 (A) 02/07/2021  Assessment & Plan:    Webb Silversmith, NP This visit occurred during the SARS-CoV-2 public health emergency.  Safety protocols were in place, including screening questions prior to the visit, additional usage of staff PPE, and extensive cleaning of exam room while observing appropriate contact time as indicated for disinfecting solutions.

## 2021-03-11 NOTE — Patient Instructions (Signed)

## 2021-03-11 NOTE — Assessment & Plan Note (Signed)
Uncontrolled on Lisinopril She reports her BP has never been this high, hx of White Coat HTN Advised her to monitor her BP at home at varying times daily x 1 week then update me via mychart Continue Lisinopril for now Reinforced DASH diet and exercise for weight loss Will monitor

## 2021-03-11 NOTE — Assessment & Plan Note (Signed)
A1C reviewed No urine microalbumin secondary to ACEI therapy Encouraged low carb diet and exercise for weight loss Advised her to schedule her diabetic eye exam Encouraged routine foot exams Continue Metformin Flu, Covid and Pneumovax UTD

## 2021-03-11 NOTE — Assessment & Plan Note (Signed)
CMET and lipid profile reviewed Encouraged he to consume a low fat diet Continue Rosuvastatin

## 2021-04-19 ENCOUNTER — Encounter
Admission: RE | Disposition: A | Discharge status: Home / Self Care | Payer: Self-pay | Source: Home / Self Care | Attending: Gastroenterology

## 2021-04-19 ENCOUNTER — Ambulatory Visit: Payer: BC Managed Care – PPO | Admitting: Certified Registered Nurse Anesthetist

## 2021-04-19 ENCOUNTER — Other Ambulatory Visit: Payer: Self-pay

## 2021-04-19 ENCOUNTER — Ambulatory Visit
Admission: RE | Admit: 2021-04-19 | Discharge: 2021-04-19 | Disposition: A | Discharge status: Home / Self Care | Payer: BC Managed Care – PPO | Attending: Gastroenterology | Admitting: Gastroenterology

## 2021-04-19 DIAGNOSIS — Z91040 Latex allergy status: Secondary | ICD-10-CM | POA: Diagnosis not present

## 2021-04-19 DIAGNOSIS — Z9049 Acquired absence of other specified parts of digestive tract: Secondary | ICD-10-CM | POA: Diagnosis not present

## 2021-04-19 DIAGNOSIS — Z79899 Other long term (current) drug therapy: Secondary | ICD-10-CM | POA: Insufficient documentation

## 2021-04-19 DIAGNOSIS — K5730 Diverticulosis of large intestine without perforation or abscess without bleeding: Secondary | ICD-10-CM | POA: Insufficient documentation

## 2021-04-19 DIAGNOSIS — D175 Benign lipomatous neoplasm of intra-abdominal organs: Secondary | ICD-10-CM

## 2021-04-19 DIAGNOSIS — Z1211 Encounter for screening for malignant neoplasm of colon: Secondary | ICD-10-CM

## 2021-04-19 DIAGNOSIS — K648 Other hemorrhoids: Secondary | ICD-10-CM | POA: Insufficient documentation

## 2021-04-19 DIAGNOSIS — D123 Benign neoplasm of transverse colon: Secondary | ICD-10-CM | POA: Insufficient documentation

## 2021-04-19 DIAGNOSIS — Z7984 Long term (current) use of oral hypoglycemic drugs: Secondary | ICD-10-CM | POA: Insufficient documentation

## 2021-04-19 DIAGNOSIS — D122 Benign neoplasm of ascending colon: Secondary | ICD-10-CM | POA: Diagnosis not present

## 2021-04-19 DIAGNOSIS — K635 Polyp of colon: Secondary | ICD-10-CM

## 2021-04-19 HISTORY — PX: COLONOSCOPY WITH PROPOFOL: SHX5780

## 2021-04-19 LAB — POCT PREGNANCY, URINE: Preg Test, Ur: NEGATIVE

## 2021-04-19 SURGERY — COLONOSCOPY WITH PROPOFOL
Anesthesia: General

## 2021-04-19 MED ORDER — PROPOFOL 500 MG/50ML IV EMUL
INTRAVENOUS | Status: AC
  Filled 2021-04-19: qty 50

## 2021-04-19 MED ORDER — PROPOFOL 500 MG/50ML IV EMUL
INTRAVENOUS | Status: DC | PRN
  Administered 2021-04-19: 140 ug/kg/min via INTRAVENOUS

## 2021-04-19 MED ORDER — PROPOFOL 10 MG/ML IV BOLUS
INTRAVENOUS | Status: AC
  Filled 2021-04-19: qty 20

## 2021-04-19 MED ORDER — GLYCOPYRROLATE 0.2 MG/ML IJ SOLN
INTRAMUSCULAR | Status: AC
  Filled 2021-04-19: qty 1

## 2021-04-19 MED ORDER — PROPOFOL 10 MG/ML IV BOLUS
INTRAVENOUS | Status: DC | PRN
  Administered 2021-04-19: 30 mg via INTRAVENOUS
  Administered 2021-04-19: 70 mg via INTRAVENOUS
  Administered 2021-04-19: 30 mg via INTRAVENOUS

## 2021-04-19 MED ORDER — PHENYLEPHRINE HCL (PRESSORS) 10 MG/ML IV SOLN
INTRAVENOUS | Status: DC | PRN
  Administered 2021-04-19: 100 ug via INTRAVENOUS

## 2021-04-19 MED ORDER — SODIUM CHLORIDE 0.9 % IV SOLN: INTRAVENOUS | Status: DC

## 2021-04-19 MED ORDER — LIDOCAINE HCL (CARDIAC) PF 100 MG/5ML IV SOSY
PREFILLED_SYRINGE | INTRAVENOUS | Status: DC | PRN
  Administered 2021-04-19: 60 mg via INTRAVENOUS

## 2021-04-19 NOTE — Op Note (Signed)
[facility] Gastroenterology Patient Name: Elizabeth Dalton Procedure Date: 04/19/2021 9:11 AM MRN: [id number] Account #: [id number] Date of Birth: 1971/01/14 Admit Type: Outpatient Age: 50 Room: [facility] ENDO ROOM 2 Gender: Female Note Status: Finalized Procedure:             Colonoscopy Indications:           Screening for colorectal malignant neoplasm Providers:             Analiya Porco B. Bonna Gains MD, MD Referring MD:          Jearld Fenton (Referring MD) Medicines:             Monitored Anesthesia Care Complications:         No immediate complications. Procedure:             Pre-Anesthesia Assessment:                        - ASA Grade Assessment: II - A patient with mild                         systemic disease.                        - Prior to the procedure, a History and Physical was                         performed, and patient medications, allergies and                         sensitivities were reviewed. The patient's tolerance                         of previous anesthesia was reviewed.                        - The risks and benefits of the procedure and the                         sedation options and risks were discussed with the                         patient. All questions were answered and informed                         consent was obtained.                        - Patient identification and proposed procedure were                         verified prior to the procedure by the physician, the                         nurse, the anesthesiologist, the anesthetist and the                         technician. The procedure was verified in the                         procedure  room.                        After obtaining informed consent, the colonoscope was                         passed under direct vision. Throughout the procedure,                         the patient's blood pressure, pulse, and oxygen                         saturations were  monitored continuously. The                         Colonoscope was introduced through the anus and                         advanced to the the cecum, identified by appendiceal                         orifice and ileocecal valve. The colonoscopy was                         performed with ease. The patient tolerated the                         procedure well. The quality of the bowel preparation                         was good. Findings:      The perianal and digital rectal examinations were normal.      Two sessile polyps were found in the ascending colon. The polyps were 5       to 6 mm in size. These polyps were removed with a cold snare. Resection       and retrieval were complete.      There was a lipoma, at the hepatic flexure. Biopsies were taken with a       cold forceps for histology. Bright yellow material seen on biopsies       suggestive of lipoma. Pillow sign was positive.      A few diverticula were found in the sigmoid colon and descending colon.      The exam was otherwise without abnormality.      The rectum, sigmoid colon, descending colon, transverse colon, ascending       colon and cecum appeared normal.      Non-bleeding internal hemorrhoids were found during retroflexion.      No additional abnormalities were found on retroflexion. Impression:            - Two 5 to 6 mm polyps in the ascending colon, removed                         with a cold snare. Resected and retrieved.                        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 Biopsied.                        -  Diverticulosis in the sigmoid colon and in the                         descending colon.                        - The examination was otherwise normal.                        - The rectum, sigmoid colon, descending colon,                         transverse colon, ascending colon and cecum are normal.                        - Non-bleeding internal hemorrhoids. Recommendation:        - Discharge patient to  home (with escort).                        - High fiber diet.                        - Advance diet as tolerated.                        - Continue present medications.                        - Await pathology results.                        - Repeat colonoscopy date to be determined after                         pending pathology results are reviewed.                        - The findings and recommendations were discussed with                         the patient.                        - The findings and recommendations were discussed with                         the patient's family.                        - Return to primary care physician as previously                         scheduled. Procedure Code(s):     --- Professional ---                        [id number], Colonoscopy, flexible; with removal of                         tumor(s), polyp(s), or other lesion(s) by snare  technique                        45380, 59, Colonoscopy, flexible; with biopsy, single                         or multiple Diagnosis Code(s):     --- Professional ---                        Z12.11, Encounter for screening for malignant neoplasm                         of colon                        K63.5, Polyp of colon                        D17.5, Benign lipomatous neoplasm of intra-abdominal                         organs CPT copyright 2019 American Medical Association. All rights reserved. The codes documented in this report are preliminary and upon coder review may  be revised to meet current compliance requirements.  Vonda Antigua, MD Margretta Sidle B. Bonna Gains MD, MD 04/19/2021 10:00:35 AM This report has been signed electronically. Number of Addenda: 0 Note Initiated On: 04/19/2021 9:11 AM Scope Withdrawal Time: 0 hours 28 minutes 28 seconds  Total Procedure Duration: 0 hours 31 minutes 34 seconds  Estimated Blood Loss:  Estimated blood loss: none.      [facility]

## 2021-04-19 NOTE — Transfer of Care (Signed)
Immediate Anesthesia Transfer of Care Note  Patient: Elizabeth Dalton Richard  Procedure(s) Performed: COLONOSCOPY WITH PROPOFOL  Patient Location: Endoscopy Unit  Anesthesia Type:General  Level of Consciousness: drowsy  Airway & Oxygen Therapy: Patient Spontanous Breathing  Post-op Assessment: Report given to RN and Post -op Vital signs reviewed and stable  Post vital signs: Reviewed and stable  Last Vitals:  Vitals Value Taken Time  BP 96/53 04/19/21 0959  Temp    Pulse 59 04/19/21 0959  Resp 17 04/19/21 0959  SpO2 96 % 04/19/21 0959  Vitals shown include unvalidated device data.  Last Pain:  Vitals:   04/19/21 0950  TempSrc: Temporal  PainSc:          Complications: No notable events documented.

## 2021-04-19 NOTE — Anesthesia Preprocedure Evaluation (Signed)
Anesthesia Evaluation  Patient identified by MRN, date of birth, ID band Patient awake    Reviewed: Allergy & Precautions, NPO status , Patient's Chart, lab work & pertinent test results  History of Anesthesia Complications Negative for: history of anesthetic complications  Airway Mallampati: II  TM Distance: >3 FB Neck ROM: Full    Dental no notable dental hx.    Pulmonary neg pulmonary ROS, neg sleep apnea, neg COPD,    breath sounds clear to auscultation- rhonchi (-) wheezing      Cardiovascular hypertension, (-) CAD, (-) Past MI, (-) Cardiac Stents and (-) CABG  Rhythm:Regular Rate:Normal - Systolic murmurs and - Diastolic murmurs    Neuro/Psych neg Seizures negative neurological ROS  negative psych ROS   GI/Hepatic negative GI ROS, Neg liver ROS,   Endo/Other  diabetes, Oral Hypoglycemic Agents  Renal/GU negative Renal ROS     Musculoskeletal negative musculoskeletal ROS (+)   Abdominal (+) + obese,   Peds  Hematology negative hematology ROS (+)   Anesthesia Other Findings Past Medical History: No date: Allergy   Reproductive/Obstetrics                             Anesthesia Physical Anesthesia Plan  ASA: 2  Anesthesia Plan: General   Post-op Pain Management:    Induction: Intravenous  PONV Risk Score and Plan: 2 and Propofol infusion  Airway Management Planned: Natural Airway  Additional Equipment:   Intra-op Plan:   Post-operative Plan:   Informed Consent: I have reviewed the patients History and Physical, chart, labs and discussed the procedure including the risks, benefits and alternatives for the proposed anesthesia with the patient or authorized representative who has indicated his/her understanding and acceptance.     Dental advisory given  Plan Discussed with: CRNA and Anesthesiologist  Anesthesia Plan Comments:         Anesthesia Quick  Evaluation

## 2021-04-19 NOTE — H&P (Signed)
Vonda Antigua, MD [address], [place], [place], [place], [address] [id number], [place], [place], [place], [address] Phone: [phone]  Fax: [phone]  Primary Care Physician:  Jearld Fenton, NP   Pre-Procedure History & Physical: HPI:  Elizabeth Dalton is a 50 y.o. female is here for a colonoscopy.   Past Medical History:  Diagnosis Date   Allergy     Past Surgical History:  Procedure Laterality Date   CHOLECYSTECTOMY      Prior to Admission medications   Medication Sig Start Date End Date Taking? Authorizing Provider  cetirizine (ZYRTEC) 10 MG tablet Take by mouth.   Yes [provider]  lisinopril (ZESTRIL) 10 MG tablet Take 1 tablet (10 mg total) by mouth daily. 02/07/21  Yes Carles Collet M, PA-C  metFORMIN (GLUCOPHAGE) 500 MG tablet TAKE 1 TABLET(500 MG) BY MOUTH TWICE DAILY WITH MEALS 08/30/20  Yes Malfi, Lupita Raider, FNP  MULTIPLE VITAMINS PO Take by mouth.   Yes [provider]  rosuvastatin (CRESTOR) 10 MG tablet TAKE 1 TABLET(10 MG) BY MOUTH DAILY 08/30/20  Yes Malfi, Lupita Raider, FNP  blood glucose meter kit and supplies KIT Dispense based on patient and insurance preference. Use up to four times daily as directed. (FOR ICD-9 250.00, 250.01). 11/15/19   Olin Hauser, DO    Allergies as of 02/22/2021 - Review Complete 02/22/2021  Allergen Reaction Noted   Latex Rash 01/21/2019    Family History  Problem Relation Age of Onset   Lung cancer Mother    Heart disease Mother    Hypertension Mother    Diabetes Mother        T1DM   Heart disease Father    Hypertension Father    Diabetes Sister    Diabetes Sister    Dementia Maternal Grandmother    Liver disease Paternal Grandfather    Alcohol abuse Paternal Grandfather    Breast cancer Neg Hx     Social History   Socioeconomic History   Marital status: Married    Spouse name: Not on file   Number of children: Not on file   Years of education: Not on  file   Highest education level: Bachelor's degree (e.g., BA, AB, BS)  Occupational History   Occupation: Psychologist, counselling: [employer]    Comment: Science  Tobacco Use   Smoking status: Never   Smokeless tobacco: Never  Vaping Use   Vaping Use: Never used  Substance and Sexual Activity   Alcohol use: Yes    Alcohol/week: 1.0 standard drink    Types: 1 Standard drinks or equivalent per week    Comment: occasionally    Drug use: Never   Sexual activity: Yes  Other Topics Concern   Not on file  Social History Narrative   Not on file   Social Determinants of Health   Financial Resource Strain: Not on file  Food Insecurity: Not on file  Transportation Needs: Not on file  Physical Activity: Not on file  Stress: Not on file  Social Connections: Not on file  Intimate Partner Violence: Not on file    Review of Systems: See HPI, otherwise negative ROS  Physical Exam: BP (!) 159/88   Pulse 89   Temp 98 F (36.7 C) (Temporal)   Resp 20   Ht $R'5\' 7"'Yk$  (1.702 m)   Wt 108.4 kg   SpO2 100%   BMI 37.43 kg/m  General:   Alert,  pleasant and cooperative in NAD Head:  Normocephalic and atraumatic. Neck:  Supple; no masses or thyromegaly. Lungs:  Clear throughout to auscultation, normal respiratory effort.    Heart:  +S1, +S2, Regular rate and rhythm, No edema. Abdomen:  Soft, nontender and nondistended. Normal bowel sounds, without guarding, and without rebound.   Neurologic:  Alert and  oriented x4;  grossly normal neurologically.  Impression/Plan: Elizabeth Dalton is here for a colonoscopy to be performed for average risk screening.  Risks, benefits, limitations, and alternatives regarding  colonoscopy have been reviewed with the patient.  Questions have been answered.  All parties agreeable.   Virgel Manifold, MD  04/19/2021, 9:18 AM

## 2021-04-19 NOTE — Anesthesia Postprocedure Evaluation (Signed)
Anesthesia Post Note  Patient: Elizabeth Dalton  Procedure(s) Performed: COLONOSCOPY WITH PROPOFOL  Patient location during evaluation: Endoscopy Anesthesia Type: General Level of consciousness: awake and alert and oriented Pain management: pain level controlled Vital Signs Assessment: post-procedure vital signs reviewed and stable Respiratory status: spontaneous breathing, nonlabored ventilation and respiratory function stable Cardiovascular status: blood pressure returned to baseline and stable Postop Assessment: no signs of nausea or vomiting Anesthetic complications: no   No notable events documented.   Last Vitals:  Vitals:   04/19/21 1020 04/19/21 1030  BP: 124/84 126/89  Pulse: 63 60  Resp: 16 (!) 22  Temp:    SpO2: 97% 97%    Last Pain:  Vitals:   04/19/21 0950  TempSrc: Temporal  PainSc:                  [name]

## 2021-04-22 ENCOUNTER — Encounter: Payer: Self-pay | Admitting: Gastroenterology

## 2021-04-22 LAB — SURGICAL PATHOLOGY

## 2021-04-23 ENCOUNTER — Encounter: Payer: Self-pay | Admitting: Gastroenterology

## 2021-05-03 ENCOUNTER — Encounter: Payer: Self-pay | Admitting: Internal Medicine

## 2021-05-07 ENCOUNTER — Other Ambulatory Visit: Payer: Self-pay

## 2021-05-07 ENCOUNTER — Encounter: Payer: Self-pay | Admitting: Internal Medicine

## 2021-05-07 DIAGNOSIS — I10 Essential (primary) hypertension: Secondary | ICD-10-CM

## 2021-05-07 MED ORDER — LISINOPRIL 10 MG PO TABS: 10.0000 mg | ORAL_TABLET | Freq: Every day | ORAL | 0 refills | Status: DC

## 2021-05-09 ENCOUNTER — Other Ambulatory Visit: Payer: Self-pay

## 2021-05-09 DIAGNOSIS — I10 Essential (primary) hypertension: Secondary | ICD-10-CM

## 2021-05-09 MED ORDER — LISINOPRIL 10 MG PO TABS: 10.0000 mg | ORAL_TABLET | Freq: Every day | ORAL | 3 refills | Status: DC

## 2021-05-28 LAB — HM DIABETES EYE EXAM

## 2021-08-11 ENCOUNTER — Other Ambulatory Visit: Payer: Self-pay | Admitting: Internal Medicine

## 2021-08-11 DIAGNOSIS — I10 Essential (primary) hypertension: Secondary | ICD-10-CM

## 2021-08-11 NOTE — Telephone Encounter (Signed)
Requested medication (s) are due for refill today: no  Requested medication (s) are on the active medication list: yes  Last refill:  05/09/21 #90 3 RF  Future visit scheduled: no  Notes to clinic:  overdue lab work   Cr in normal range and within 180 days   K in normal range and within 180 days     Requested Prescriptions  Pending Prescriptions Disp Refills   lisinopril (ZESTRIL) 10 MG tablet [Pharmacy Med Name: LISINOPRIL 10MG  TABLETS] 90 tablet 3    Sig: TAKE 1 TABLET(10 MG) BY MOUTH DAILY     Cardiovascular:  ACE Inhibitors Failed - 08/11/2021  3:23 AM      Failed - Cr in normal range and within 180 days    Creat  Date Value Ref Range Status  08/30/2020 0.78 0.50 - 1.10 mg/dL Final          Failed - K in normal range and within 180 days    Potassium  Date Value Ref Range Status  08/30/2020 4.1 3.5 - 5.3 mmol/L Final  06/02/2012 3.9 3.5 - 5.1 mmol/L Final          Passed - Patient is not pregnant      Passed - Last BP in normal range    BP Readings from Last 1 Encounters:  04/19/21 126/89          Passed - Valid encounter within last 6 months    Recent Outpatient Visits           5 months ago Class 2 severe obesity due to excess calories with serious comorbidity and body mass index (BMI) of 36.0 to 36.9 in adult [facility])   [place], NP   6 months ago Annual physical exam   [facility] Carles Collet M, [place]   11 months ago Type 2 diabetes mellitus with hyperglycemia, without long-term current use of insulin [facility])   [facility], Lupita Raider, FNP   1 year ago Annual physical exam   [facility], [place], [place]   1 year ago Acute pain of left shoulder   [place], Devonne Doughty, DO

## 2021-08-13 ENCOUNTER — Other Ambulatory Visit: Payer: Self-pay

## 2021-08-13 DIAGNOSIS — E785 Hyperlipidemia, unspecified: Secondary | ICD-10-CM

## 2021-08-13 DIAGNOSIS — E1165 Type 2 diabetes mellitus with hyperglycemia: Secondary | ICD-10-CM

## 2021-08-13 MED ORDER — ROSUVASTATIN CALCIUM 10 MG PO TABS: ORAL_TABLET | ORAL | 3 refills | Status: DC

## 2021-11-15 ENCOUNTER — Other Ambulatory Visit: Payer: Self-pay

## 2021-11-15 DIAGNOSIS — E1165 Type 2 diabetes mellitus with hyperglycemia: Secondary | ICD-10-CM

## 2021-11-15 MED ORDER — METFORMIN HCL 500 MG PO TABS: ORAL_TABLET | ORAL | 0 refills | Status: DC

## 2022-02-11 ENCOUNTER — Telehealth: Payer: Self-pay

## 2022-02-11 DIAGNOSIS — Z0000 Encounter for general adult medical examination without abnormal findings: Secondary | ICD-10-CM

## 2022-02-11 DIAGNOSIS — Z114 Encounter for screening for human immunodeficiency virus [HIV]: Secondary | ICD-10-CM

## 2022-02-11 DIAGNOSIS — Z1159 Encounter for screening for other viral diseases: Secondary | ICD-10-CM

## 2022-02-11 NOTE — Telephone Encounter (Signed)
Copied from [place] [id number]. Topic: Appointment Scheduling - Scheduling Inquiry for Clinic ?>> Feb 11, 2022  9:22 AM Oneta Rack wrote: ?Patient scheduled her physical appointment for 02/14/2022 at 2pm but would like PCP to place physical lab orders so she can come in in the morning because she is unable to fast. Please contact patient once labs have been placed. ?

## 2022-02-12 NOTE — Addendum Note (Signed)
Addended by: Jearld Fenton on: 02/12/2022 07:05 AM ? ? Modules accepted: Orders ? ?

## 2022-02-12 NOTE — Telephone Encounter (Signed)
Labs ordered.

## 2022-02-12 NOTE — Telephone Encounter (Signed)
Pt advised.  Apt 8:15 on 02/13/2022 ? ? ?Thanks,  ? ?-Mickel Baas  ?

## 2022-02-13 ENCOUNTER — Other Ambulatory Visit: Payer: BC Managed Care – PPO

## 2022-02-13 DIAGNOSIS — Z114 Encounter for screening for human immunodeficiency virus [HIV]: Secondary | ICD-10-CM

## 2022-02-13 DIAGNOSIS — Z1159 Encounter for screening for other viral diseases: Secondary | ICD-10-CM

## 2022-02-13 DIAGNOSIS — Z0000 Encounter for general adult medical examination without abnormal findings: Secondary | ICD-10-CM

## 2022-02-14 ENCOUNTER — Encounter: Payer: Self-pay | Admitting: Internal Medicine

## 2022-02-14 ENCOUNTER — Ambulatory Visit (INDEPENDENT_AMBULATORY_CARE_PROVIDER_SITE_OTHER): Payer: BC Managed Care – PPO | Admitting: Internal Medicine

## 2022-02-14 VITALS — BP 134/82 | HR 88 | Temp 97.3°F | Ht 67.0 in | Wt 240.0 lb

## 2022-02-14 DIAGNOSIS — E6609 Other obesity due to excess calories: Secondary | ICD-10-CM | POA: Insufficient documentation

## 2022-02-14 DIAGNOSIS — I10 Essential (primary) hypertension: Secondary | ICD-10-CM

## 2022-02-14 DIAGNOSIS — Z6837 Body mass index (BMI) 37.0-37.9, adult: Secondary | ICD-10-CM | POA: Diagnosis not present

## 2022-02-14 DIAGNOSIS — Z1231 Encounter for screening mammogram for malignant neoplasm of breast: Secondary | ICD-10-CM

## 2022-02-14 DIAGNOSIS — E66812 Obesity, class 2: Secondary | ICD-10-CM | POA: Insufficient documentation

## 2022-02-14 DIAGNOSIS — E66811 Obesity, class 1: Secondary | ICD-10-CM | POA: Insufficient documentation

## 2022-02-14 DIAGNOSIS — E785 Hyperlipidemia, unspecified: Secondary | ICD-10-CM | POA: Diagnosis not present

## 2022-02-14 DIAGNOSIS — E1165 Type 2 diabetes mellitus with hyperglycemia: Secondary | ICD-10-CM | POA: Diagnosis not present

## 2022-02-14 DIAGNOSIS — Z0001 Encounter for general adult medical examination with abnormal findings: Secondary | ICD-10-CM | POA: Diagnosis not present

## 2022-02-14 LAB — COMPLETE METABOLIC PANEL WITH GFR
AG Ratio: 1.4 (calc) (ref 1.0–2.5)
ALT: 31 U/L — ABNORMAL HIGH (ref 6–29)
AST: 21 U/L (ref 10–35)
Albumin: 4.2 g/dL (ref 3.6–5.1)
Alkaline phosphatase (APISO): 75 U/L (ref 37–153)
BUN: 13 mg/dL (ref 7–25)
CO2: 26 mmol/L (ref 20–32)
Calcium: 9.4 mg/dL (ref 8.6–10.4)
Chloride: 103 mmol/L (ref 98–110)
Creat: 0.74 mg/dL (ref 0.50–1.03)
Globulin: 2.9 g/dL (calc) (ref 1.9–3.7)
Glucose, Bld: 140 mg/dL — ABNORMAL HIGH (ref 65–99)
Potassium: 4.5 mmol/L (ref 3.5–5.3)
Sodium: 137 mmol/L (ref 135–146)
Total Bilirubin: 0.4 mg/dL (ref 0.2–1.2)
Total Protein: 7.1 g/dL (ref 6.1–8.1)
eGFR: 98 mL/min/{1.73_m2} (ref >=60)

## 2022-02-14 LAB — CBC
HCT: 45.9 % — ABNORMAL HIGH (ref 35.0–45.0)
Hemoglobin: 14.8 g/dL (ref 11.7–15.5)
MCH: 28.4 pg (ref 27.0–33.0)
MCHC: 32.2 g/dL (ref 32.0–36.0)
MCV: 87.9 fL (ref 80.0–100.0)
MPV: 9.5 fL (ref 7.5–12.5)
Platelets: 234 10*3/uL (ref 140–400)
RBC: 5.22 10*6/uL — ABNORMAL HIGH (ref 3.80–5.10)
RDW: 12.5 % (ref 11.0–15.0)
WBC: 8.3 10*3/uL (ref 3.8–10.8)

## 2022-02-14 LAB — HEMOGLOBIN A1C
Hgb A1c MFr Bld: 7.2 % of total Hgb — ABNORMAL HIGH (ref <5.7)
Mean Plasma Glucose: 160 mg/dL
eAG (mmol/L): 8.9 mmol/L

## 2022-02-14 LAB — LIPID PANEL
Cholesterol: 153 mg/dL (ref <200)
HDL: 60 mg/dL (ref >=50)
LDL Cholesterol (Calc): 72 mg/dL (calc)
Non-HDL Cholesterol (Calc): 93 mg/dL (calc) (ref <130)
Total CHOL/HDL Ratio: 2.6 (calc) (ref <5.0)
Triglycerides: 125 mg/dL (ref <150)

## 2022-02-14 LAB — HEPATITIS C ANTIBODY
Hepatitis C Ab: NONREACTIVE
SIGNAL TO CUT-OFF: 0.43 (ref <1.00)

## 2022-02-14 LAB — HIV ANTIBODY (ROUTINE TESTING W REFLEX): HIV 1&2 Ab, 4th Generation: NONREACTIVE

## 2022-02-14 LAB — MICROALBUMIN / CREATININE URINE RATIO
Creatinine, Urine: 90 mg/dL (ref 20–275)
Microalb Creat Ratio: 7 mcg/mg creat (ref <30)
Microalb, Ur: 0.6 mg/dL

## 2022-02-14 MED ORDER — METFORMIN HCL 500 MG PO TABS: ORAL_TABLET | ORAL | 1 refills | Status: DC

## 2022-02-14 NOTE — Progress Notes (Signed)
? ?Subjective:  ? ? Patient ID: Elizabeth Dalton, female    DOB: [birth year], 51 y.o.   MRN: [id number] ? ?HPI ? ?Pt presents to the clinic today for her annual exam. She is also due for follow up of chronic conditions. ? ?HTN: Her BP today is 148/96. She is taking Lisinopril as prescribed. ECG from 05/2012 reviewed. ? ?HLD: Her last LDL was 72, triglycerides 125, 02/2022. She denies myalgias on Rosuvastatin (a few times per week). She tries to consume a low fat diet. ? ?DM 2: Her last A1C was 7.2%, 02/2022. She is taking Metformin as prescribed. She does not chech her sugars. She checks her feet routinely. Her last eye exam was 04/2021. Flu 07/2021. Pneumovax 02/2021. Covid Moderna x 3. ? ?Flu: 07/2021 ?Tetanus: 04/2018 ?Covid: Moderna x 3 ?Pneumovax: 02/2021 ?Shingrix: never ?Pap smear: 02/2021 ?Mammogram: 02/8021 ?Colon screening: 04/2021 ?Vision screening: annually ?Dentist: biannually ? ?Diet: She does eat meat. She consumes fruits and veggies. She does eat fried foods. She drinks mostly water ?Exercise: None ? ?Review of Systems ? ? ?Past Medical History:  ?Diagnosis Date  ? Allergy   ? ? ?Current Outpatient Medications  ?Medication Sig Dispense Refill  ? blood glucose meter kit and supplies KIT Dispense based on patient and insurance preference. Use up to four times daily as directed. (FOR ICD-9 250.00, 250.01). 1 each 0  ? cetirizine (ZYRTEC) 10 MG tablet Take by mouth.    ? lisinopril (ZESTRIL) 10 MG tablet Take 1 tablet (10 mg total) by mouth daily. 90 tablet 3  ? metFORMIN (GLUCOPHAGE) 500 MG tablet TAKE 1 TABLET(500 MG) BY MOUTH TWICE DAILY WITH MEALS 180 tablet 0  ? MULTIPLE VITAMINS PO Take by mouth.    ? rosuvastatin (CRESTOR) 10 MG tablet TAKE 1 TABLET(10 MG) BY MOUTH DAILY 90 tablet 3  ? ?No current facility-administered medications for this visit.  ? ? ?Allergies  ?Allergen Reactions  ? Latex Rash  ? ? ?Family History  ?Problem Relation Age of Onset  ? Lung cancer Mother   ? Heart disease Mother   ?  Hypertension Mother   ? Diabetes Mother   ?     T1DM  ? Heart disease Father   ? Hypertension Father   ? Diabetes Sister   ? Diabetes Sister   ? Dementia Maternal Grandmother   ? Liver disease Paternal Grandfather   ? Alcohol abuse Paternal Grandfather   ? Breast cancer Neg Hx   ? ? ?Social History  ? ?Socioeconomic History  ? Marital status: Married  ?  Spouse name: Not on file  ? Number of children: Not on file  ? Years of education: Not on file  ? Highest education level: Bachelor's degree (e.g., BA, AB, BS)  ?Occupational History  ? Occupation: Patent examiner  ?  Employer: [employer]  ?  Comment: Science  ?Tobacco Use  ? Smoking status: Never  ? Smokeless tobacco: Never  ?Vaping Use  ? Vaping Use: Never used  ?Substance and Sexual Activity  ? Alcohol use: Yes  ?  Alcohol/week: 1.0 standard drink  ?  Types: 1 Standard drinks or equivalent per week  ?  Comment: occasionally   ? Drug use: Never  ? Sexual activity: Yes  ?Other Topics Concern  ? Not on file  ?Social History Narrative  ? Not on file  ? ?Social Determinants of Health  ? ?Financial Resource Strain: Not on file  ?Food Insecurity: Not on file  ?Transportation  Needs: Not on file  ?Physical Activity: Not on file  ?Stress: Not on file  ?Social Connections: Not on file  ?Intimate Partner Violence: Not on file  ? ? ? ?Constitutional: Denies fever, malaise, fatigue, headache or abrupt weight changes.  ?HEENT: Denies eye pain, eye redness, ear pain, ringing in the ears, wax buildup, runny nose, nasal congestion, bloody nose, or sore throat. ?Respiratory: Denies difficulty breathing, shortness of breath, cough or sputum production.   ?Cardiovascular: Denies chest pain, chest tightness, palpitations or swelling in the hands or feet.  ?Gastrointestinal: Denies abdominal pain, bloating, constipation, diarrhea or blood in the stool.  ?GU: Denies urgency, frequency, pain with urination, burning sensation, blood in urine, odor or  discharge. ?Musculoskeletal: Pt reports intermittent right knee pain. Denies decrease in range of motion, difficulty with gait, muscle pain or joint swelling.  ?Skin: Denies redness, rashes, lesions or ulcercations.  ?Neurological: Pt reports numbness in her left ring finger. Denies dizziness, difficulty with memory, difficulty with speech or problems with balance and coordination.  ?Psych: Denies anxiety, depression, SI/HI. ? ?No other specific complaints in a complete review of systems (except as listed in HPI above). ?   ?Objective:  ? Physical Exam ? ?BP (!) 148/96 (BP Location: Left Arm, Patient Position: Sitting, Cuff Size: Large)   Pulse 88   Temp (!) 97.3 ?F (36.3 ?C) (Temporal)   Ht $R'5\' 7"'yG$  (1.702 m)   Wt 240 lb (108.9 kg)   SpO2 99%   BMI 37.59 kg/m?  ? ?Wt Readings from Last 3 Encounters:  ?04/19/21 239 lb (108.4 kg)  ?03/11/21 236 lb 3.2 oz (107.1 kg)  ?02/07/21 238 lb 9.6 oz (108.2 kg)  ? ? ?General: Appears her stated age, obese, in NAD. ?Skin: Warm, dry and intact. No ulcerations noted. ?HEENT: Head: normal shape and size; Eyes: sclera white, PERRLA and EOMs intact;  ?Neck:  Neck supple, trachea midline. No masses, lumps or thyromegaly present.  ?Cardiovascular: Normal rate and rhythm. S1,S2 noted.  No murmur, rubs or gallops noted. No JVD or BLE edema. No carotid bruits noted. ?Pulmonary/Chest: Normal effort and positive vesicular breath sounds. No respiratory distress. No wheezes, rales or ronchi noted.  ?Abdomen:Normal bowel sounds.  ?Musculoskeletal: Normal flexion, extension, rotation and lateral bending of the cervical spine.  Normal internal and external rotation of the left shoulder.  Normal flexion and extension of the right knee.  No pain with palpation of the right knee.  No joint swelling noted.  Strength 5/5 BUE/BLE.  No difficulty with gait.  ?Neurological: Alert and oriented. Cranial nerves II-XII grossly intact.  Negative Tinel's.  Negative Phalen's.  Coordination normal.   ?Psychiatric: Mood and affect normal. Behavior is normal. Judgment and thought content normal.  ? ? ?BMET ?   ?Component Value Date/Time  ? NA 137 02/13/2022 0811  ? NA 139 06/02/2012 1926  ? K 4.5 02/13/2022 0811  ? K 3.9 06/02/2012 1926  ? CL 103 02/13/2022 0811  ? CL 103 06/02/2012 1926  ? CO2 26 02/13/2022 0811  ? CO2 26 06/02/2012 1926  ? GLUCOSE 140 (H) 02/13/2022 6270  ? GLUCOSE 129 (H) 06/02/2012 1926  ? BUN 13 02/13/2022 0811  ? BUN 10 06/02/2012 1926  ? CREATININE 0.74 02/13/2022 0811  ? CALCIUM 9.4 02/13/2022 0811  ? CALCIUM 8.7 06/02/2012 1926  ? GFRNONAA 89 08/30/2020 1023  ? GFRAA 103 08/30/2020 1023  ? ? ?Lipid Panel  ?   ?Component Value Date/Time  ? CHOL 153 02/13/2022 0811  ?  TRIG 125 02/13/2022 0811  ? HDL 60 02/13/2022 0811  ? CHOLHDL 2.6 02/13/2022 0811  ? [place] 72 02/13/2022 0811  ? ? ?CBC ?   ?Component Value Date/Time  ? WBC 8.3 02/13/2022 0811  ? RBC 5.22 (H) 02/13/2022 3567  ? HGB 14.8 02/13/2022 0811  ? HGB 14.6 06/02/2012 1926  ? HCT 45.9 (H) 02/13/2022 0141  ? HCT 42.6 06/02/2012 1926  ? PLT 234 02/13/2022 0811  ? PLT 250 06/02/2012 1926  ? MCV 87.9 02/13/2022 0811  ? MCV 85 06/02/2012 1926  ? MCH 28.4 02/13/2022 0811  ? MCHC 32.2 02/13/2022 0811  ? RDW 12.5 02/13/2022 0811  ? RDW 13.5 06/02/2012 1926  ? LYMPHSABS 2,936 08/30/2020 1023  ? LYMPHSABS 3.3 05/17/2012 0630  ? MONOABS 0.6 05/17/2012 0630  ? EOSABS 98 08/30/2020 1023  ? EOSABS 0.2 05/17/2012 0630  ? BASOSABS 41 08/30/2020 1023  ? BASOSABS 0.0 05/17/2012 0630  ? ? ?Hgb A1C ?Lab Results  ?Component Value Date  ? HGBA1C 7.2 (H) 02/13/2022  ? ? ? ? ? ? ?   ?Assessment & Plan:  ? ?Preventative Health Maintenance: ? ?Encouraged her to get a flu shot in the fall ?Tetanus UTD ?Encouraged her to get a covid booster ?Discussed Shingrix vaccine, she will check coverage with her insurance company ?Pap smear UTD ?Mammogram UTD ?Colon screening UTD ?Encouraged her to consume a balanced diet and exercise regimen ?Advised her to see an eye  doctor and dentist annually ?Labs reviewed ? ?RTC in 6 months, follow-up chronic conditions ?Webb Silversmith, NP ? ? ?

## 2022-02-14 NOTE — Patient Instructions (Signed)

## 2022-02-14 NOTE — Assessment & Plan Note (Signed)
Elevated today but manual repeat normal ?Reinforced DASH diet and exercise for weight loss ?Continue Lisinopril ?

## 2022-02-14 NOTE — Assessment & Plan Note (Signed)
C-Met and lipid profile reviewed ?Encouraged her to consume low-fat diet ?Continue Rosuvastatin ?

## 2022-02-14 NOTE — Assessment & Plan Note (Signed)
Encourage diet and exercise for weight loss 

## 2022-02-14 NOTE — Assessment & Plan Note (Signed)
A1c and urine microalbumin reviewed ?Encouraged her to consume a low-carb diet and exercise for weight loss ?Continue Metformin ?Encourage routine eye exams ?Encourage routine foot exams ?Encouraged her to flu shot in the fall ?Pneumovax UTD ?Encouraged her to get her COVID booster ?

## 2022-04-21 ENCOUNTER — Ambulatory Visit
Admission: RE | Admit: 2022-04-21 | Discharge: 2022-04-21 | Disposition: A | Discharge status: Home / Self Care | Payer: BC Managed Care – PPO | Source: Ambulatory Visit | Attending: Internal Medicine | Admitting: Internal Medicine

## 2022-04-21 DIAGNOSIS — Z1231 Encounter for screening mammogram for malignant neoplasm of breast: Secondary | ICD-10-CM | POA: Diagnosis present

## 2022-05-12 ENCOUNTER — Other Ambulatory Visit: Payer: Self-pay | Admitting: Internal Medicine

## 2022-05-12 DIAGNOSIS — I10 Essential (primary) hypertension: Secondary | ICD-10-CM

## 2022-05-13 ENCOUNTER — Telehealth: Payer: Self-pay

## 2022-05-13 IMAGING — MG MM DIGITAL SCREENING BILAT W/ TOMO AND CAD
6 of 12 series · 6 of 36 positions shown · non-contrast
Comparison: Previous exam(s).

ACR Breast Density Category a: The breast tissue is almost entirely
fatty.

CLINICAL DATA: Screening.

EXAM:
DIGITAL SCREENING BILATERAL MAMMOGRAM WITH TOMOSYNTHESIS AND CAD
TECHNIQUE: Bilateral screening digital craniocaudal and mediolateral oblique
mammograms were obtained. Bilateral screening digital breast
tomosynthesis was performed. The images were evaluated with
computer-aided detection.

[L CC synth-2D]
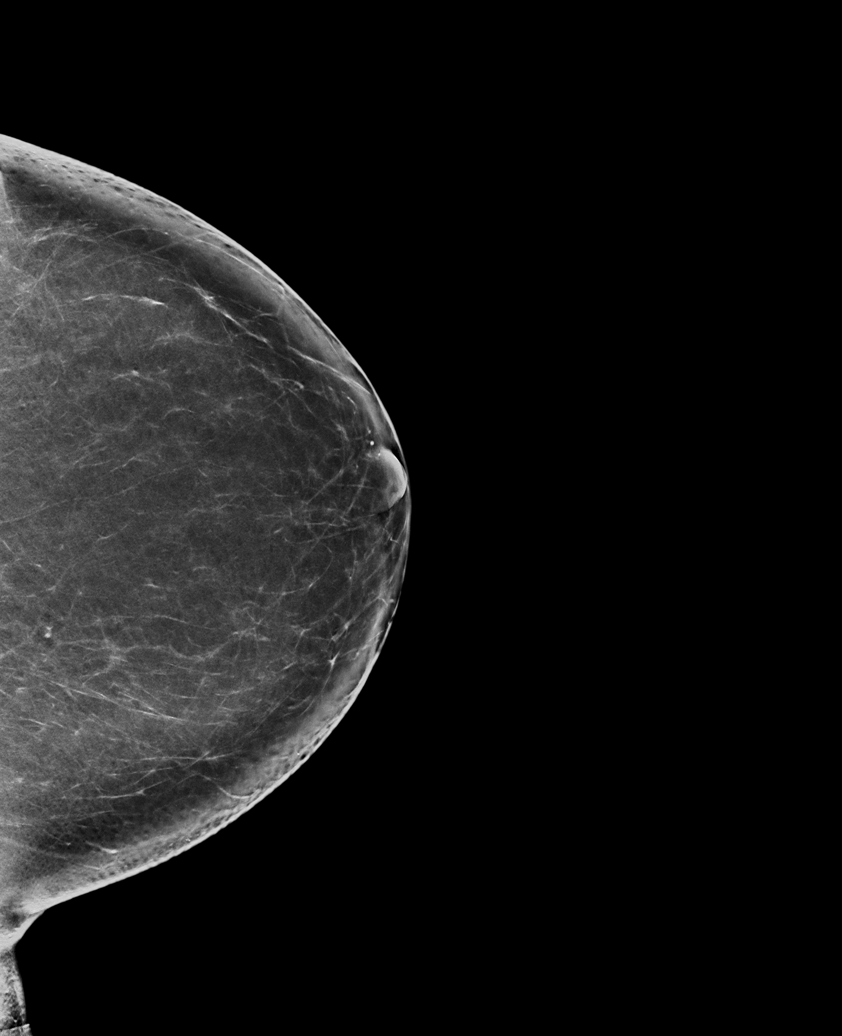

[R MLO synth-2D (1 of 2)]
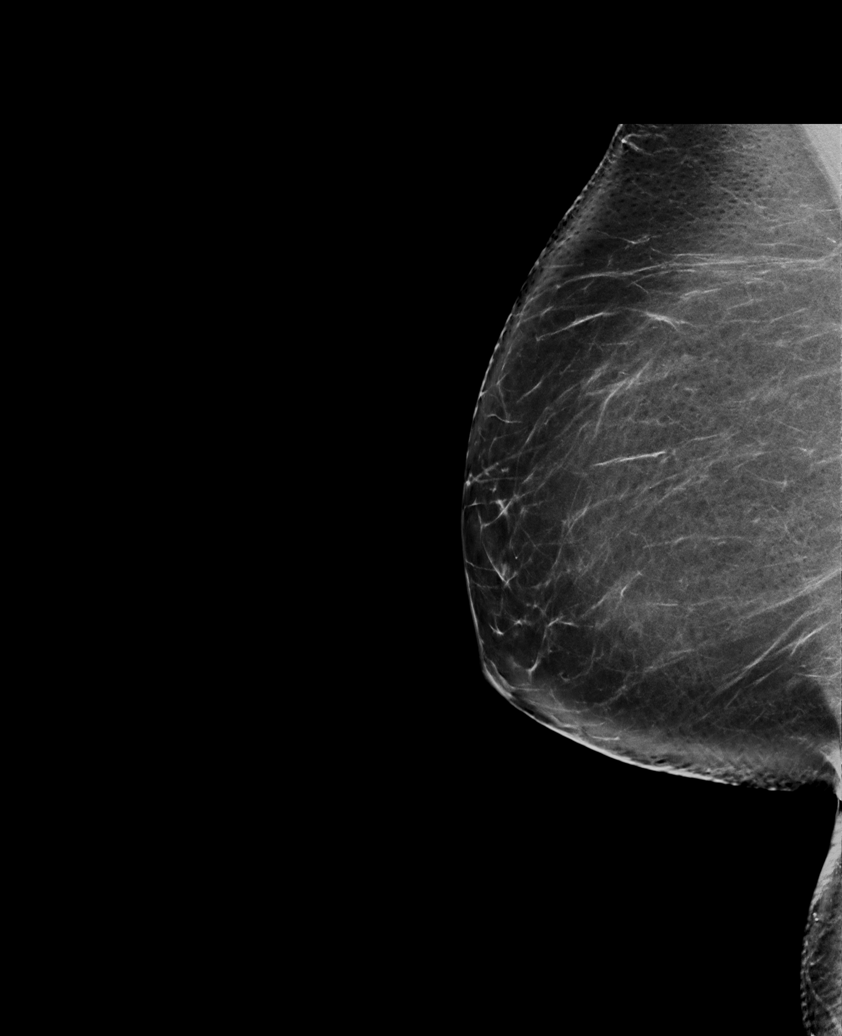

[L MLO synth-2D (1 of 2)]
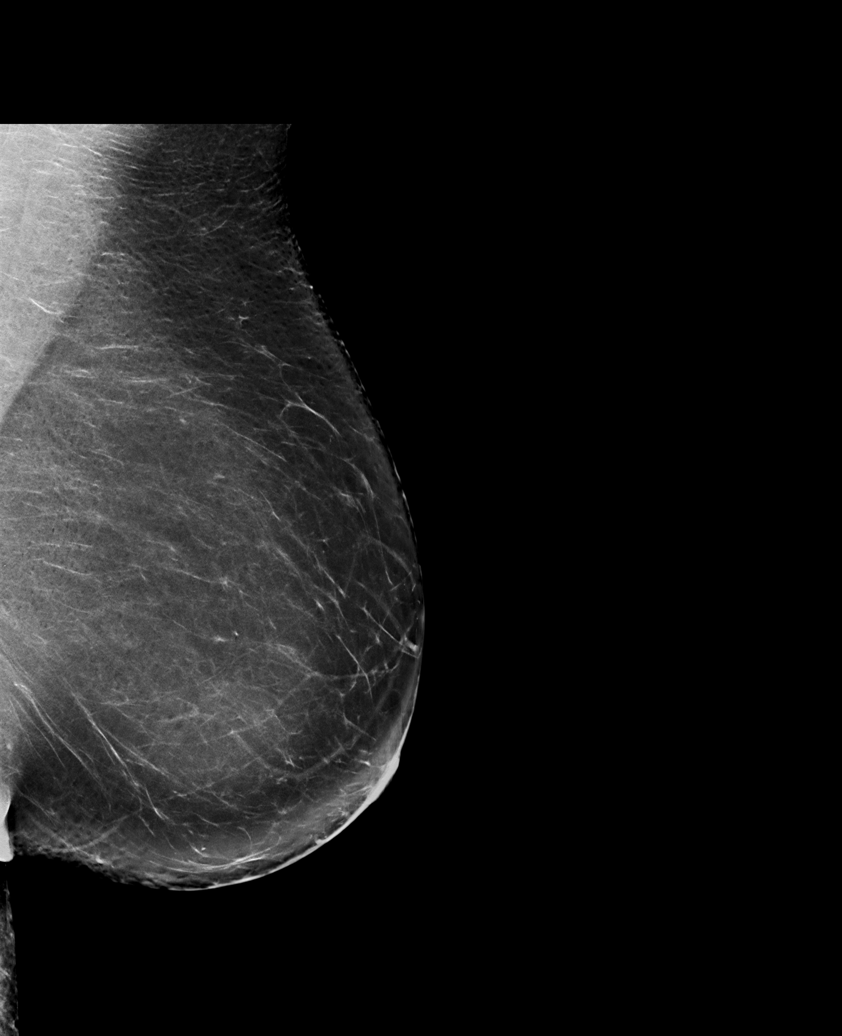

[L MLO synth-2D (2 of 2)]
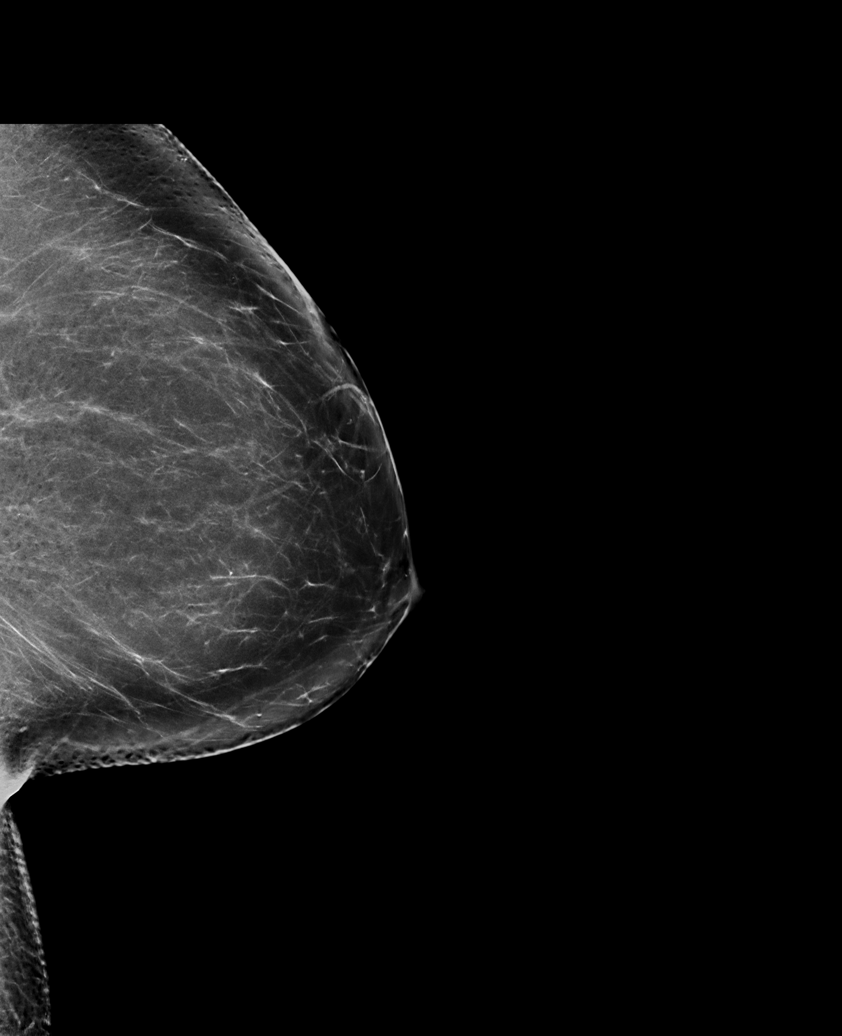

[R CC synth-2D]
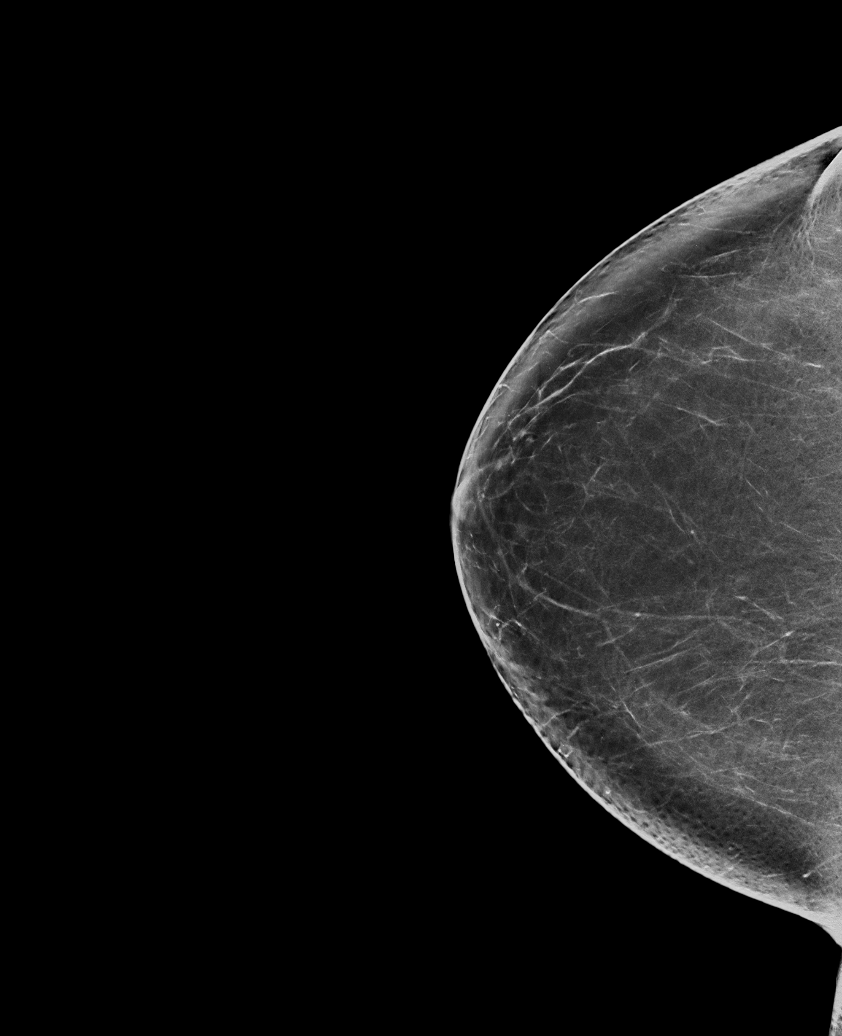

[R MLO synth-2D (2 of 2)]
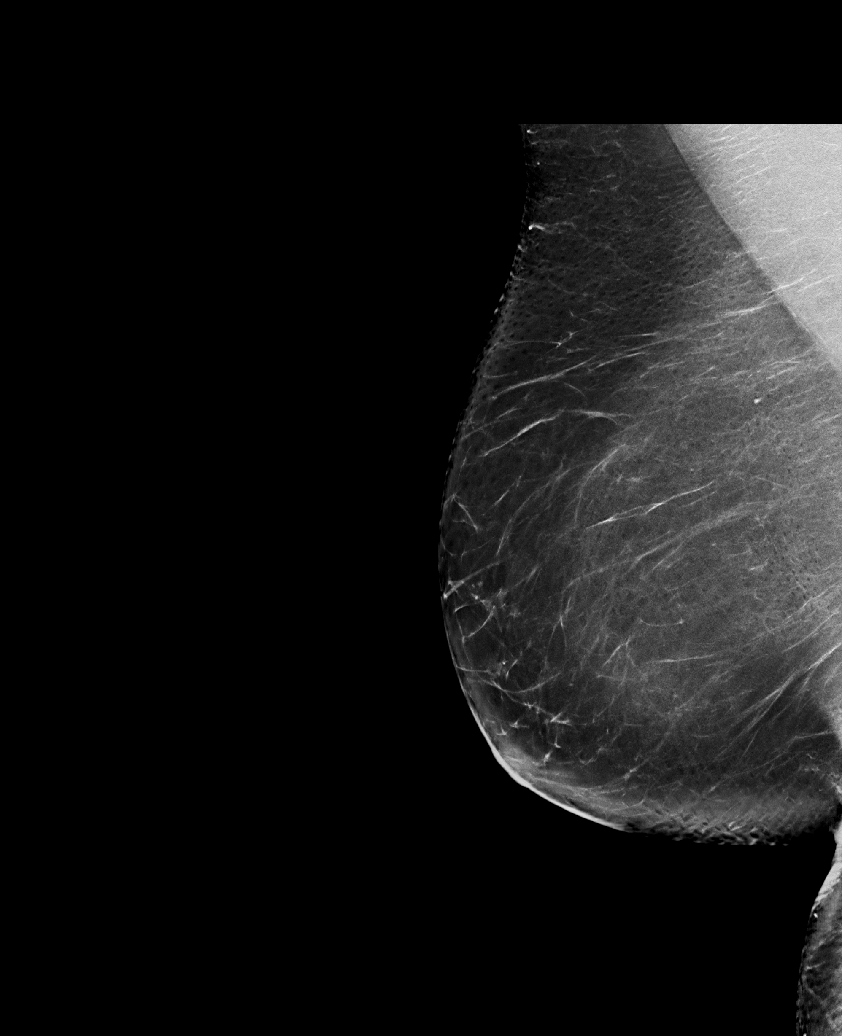

[6 of 36 positions shown; findings below may reference images not displayed]

FINDINGS: There are no findings suspicious for malignancy. The images were
evaluated with computer-aided detection.
IMPRESSION: No mammographic evidence of malignancy. A result letter of this
screening mammogram will be mailed directly to the patient.

RECOMMENDATION:
Screening mammogram in one year. (Code:[id number])

BI-RADS CATEGORY  1: Negative.

## 2022-05-13 NOTE — Telephone Encounter (Unsigned)
Copied from [place] [phone]. Topic: Referral - Request for Referral >> May 12, 2022  2:43 PM Cyndi Bender wrote: Has patient seen PCP for this complaint? Yes.   *If NO, is insurance requiring patient see PCP for this issue before PCP can refer them? Referral for which specialty: Ortho Preferred provider/office: [facility] Reason for referral: right knee pain

## 2022-05-13 NOTE — Telephone Encounter (Signed)
Requested medication (s) are due for refill today: Yes  Requested medication (s) are on the active medication list: Yes  Last refill:  05/09/21  Future visit scheduled: No  Notes to clinic:  Prescription has expired.    Requested Prescriptions  Pending Prescriptions Disp Refills   lisinopril (ZESTRIL) 10 MG tablet [Pharmacy Med Name: LISINOPRIL '10MG'$  TABLETS] 90 tablet 3    Sig: TAKE 1 TABLET(10 MG) BY MOUTH DAILY     Cardiovascular:  ACE Inhibitors Passed - 05/12/2022  3:19 AM      Passed - Cr in normal range and within 180 days    Creat  Date Value Ref Range Status  02/13/2022 0.74 0.50 - 1.03 mg/dL Final   Creatinine, Urine  Date Value Ref Range Status  02/13/2022 90 20 - 275 mg/dL Final         Passed - K in normal range and within 180 days    Potassium  Date Value Ref Range Status  02/13/2022 4.5 3.5 - 5.3 mmol/L Final  06/02/2012 3.9 3.5 - 5.1 mmol/L Final         Passed - Patient is not pregnant      Passed - Last BP in normal range    BP Readings from Last 1 Encounters:  02/14/22 134/82         Passed - Valid encounter within last 6 months    Recent Outpatient Visits           2 months ago Encounter for general adult medical examination with abnormal findings   [facility], Coralie Keens, NP   1 year ago Class 2 severe obesity due to excess calories with serious comorbidity and body mass index (BMI) of 36.0 to 36.9 in adult [facility])   [facility], Coralie Keens, NP   1 year ago Annual physical exam   [facility], [place], [place]   1 year ago Type 2 diabetes mellitus with hyperglycemia, without long-term current use of insulin [facility])   [facility], Lupita Raider, FNP   2 years ago Annual physical exam   [facility] [place], Fabio Bering [place], [place]

## 2022-05-14 NOTE — Telephone Encounter (Signed)
She needs to schedule an appt with me first, we need to obtain xray of right knee prior to referral to ortho.

## 2022-05-15 NOTE — Telephone Encounter (Signed)
LMTCB 05/15/2022.  PEC please advise pt and schedule pt. Rollene Fare is out this week but Talitha Givens, PA-C is covering for her this week.)   Thanks,   -Mickel Baas

## 2022-05-27 NOTE — Telephone Encounter (Signed)
Patient scheduled the next available with Dr. Raliegh Ip on 05/28/2022 at 4 pm.

## 2022-05-28 ENCOUNTER — Ambulatory Visit (INDEPENDENT_AMBULATORY_CARE_PROVIDER_SITE_OTHER): Payer: BC Managed Care – PPO | Admitting: Family Medicine

## 2022-05-28 ENCOUNTER — Ambulatory Visit
Admission: RE | Admit: 2022-05-28 | Discharge: 2022-05-28 | Disposition: A | Discharge status: Home / Self Care | Payer: BC Managed Care – PPO | Source: Ambulatory Visit | Attending: Family Medicine | Admitting: Family Medicine

## 2022-05-28 ENCOUNTER — Ambulatory Visit
Admission: RE | Admit: 2022-05-28 | Discharge: 2022-05-28 | Disposition: A | Discharge status: Home / Self Care | Payer: BC Managed Care – PPO | Attending: Family Medicine | Admitting: Family Medicine

## 2022-05-28 ENCOUNTER — Encounter: Payer: Self-pay | Admitting: Family Medicine

## 2022-05-28 VITALS — BP 164/88 | HR 78 | Ht 67.0 in | Wt 240.0 lb

## 2022-05-28 DIAGNOSIS — M25561 Pain in right knee: Secondary | ICD-10-CM | POA: Insufficient documentation

## 2022-05-28 DIAGNOSIS — G8929 Other chronic pain: Secondary | ICD-10-CM

## 2022-05-28 NOTE — Patient Instructions (Addendum)
Thank you for coming to the office today.  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 - Possible to have degenerative arthritis in the bones of your knee as well  I do not think you have a torn meniscus or ligament  - X-rays today, I will send results to MyChart with information once we receive it, likely within 24 hours  START anti inflammatory topical - OTC Voltaren (generic Diclofenac) topical 2-4 times a day as needed for pain swelling of affected joint for 1-2 weeks or longer.  If not improving - we could try a short course of oral Anti-inflammatory with Ibuprofen '200mg'$  2-3 tabs - take one with food and plenty of water THREE times daily for up to 1-2 weeks  It is safe to take Tylenol Ext Str '500mg'$  tabs - take 1 to 2 (max dose '1000mg'$ ) every 6 hours as needed for breakthrough pain, max 24 hour daily dose is 6 to 8 tablets or '4000mg'$   Use RICE therapy: - R - Rest / relative rest with activity modification avoid overuse and frequent bending or pressure on bent knee - I - Ice packs (make sure you use a towel or sock / something to protect skin) - C - Compression with flexible Knee Sleeve to apply pressure and reduce swelling allowing more support, also I do think it may be worth while to try a KNEE PATELLAR STRAP, pictured below, it sits directly below your kneecap on the tendon and allows support with patellar movement, which I think is part of your problem - E - Elevation - if significant swelling, lift leg above heart level (toes above your nose) to help reduce swelling, most helpful at night after day of being on your feet     Future consider Physical therapy if improving but not resolved.  Can consider Orthopedics vs Sports Medicine if / when interested if not improving.  Please schedule a Follow-up Appointment to: Return in about 4 weeks (around 06/25/2022), or if symptoms worsen or  fail to improve, for 4 weeks as needed R knee pain.  If you have any other questions or concerns, please feel free to call the office or send a message through [place]. You may also schedule an earlier appointment if necessary.  Additionally, you may be receiving a survey about your experience at our office within a few days to 1 week by e-mail or mail. We value your feedback.  Nobie Putnam, DO [place] can stretch your leg right away by doing the first 3 stretching exercises. Start strengthening your leg by doing the last 3 exercises.  Hamstring stretch on wall: Lie on your back with your buttocks close to a doorway, and extend your legs straight out in front of you along the floor. Raise the injured leg and rest it against the wall next to the door frame. Your other leg should extend through the doorway. You should feel a stretch in the back of your thigh. Hold this position for 15 to 30 seconds. Repeat 3 times.  Standing calf stretch: Facing a wall, put your hands against the wall at about eye level. Keep the injured leg back, the uninjured leg forward, and the heel of your injured leg on the floor. Turn your injured foot slightly inward (as if you were pigeon-toed) as you slowly lean into the wall until you  feel a stretch in the back of your calf. Hold for 15 to 30 seconds. Repeat 3 times. Do this exercise several times each day.  Quadriceps stretch: Stand an arm's length away from the wall, facing straight ahead. Brace yourself by keeping the hand on the uninjured side against the wall. With your other hand, grasp the ankle of the injured leg and pull your heel toward your buttocks. Don't arch or twist your back and keep your knees together. Hold this stretch for 15 to 30 seconds. Repeat 3 times.  Quadriceps isometrics: Sitting on the floor with your injured leg straight and your other leg bent, press the back of your knee into the floor by  tightening the muscles on the top of your thigh. Hold this position 10 seconds. Relax. Do 3 sets of 10.  Heel slide: Sit on a firm surface with your legs straight in front of you. Slowly slide the heel of your injured leg toward your buttock by pulling your knee to your chest as you slide. Return to the starting position. Do 3 sets of 10.  Hamstring isometrics: Sitting on the floor with the injured leg slightly bent, dig the heel of your injured leg into the floor and tighten up the back of your thigh muscles. Hold this position for 5 seconds. Do 3 sets of 10.

## 2022-05-28 NOTE — Progress Notes (Signed)
Subjective:    Patient ID: Elizabeth Dalton, female    DOB: Mar 08, 1971, 51 y.o.   MRN: [id number]  Elizabeth Dalton is a 51 y.o. female presenting on 05/28/2022 for Knee Pain  Patient presents for a same day appointment.  PCP Nicki Reaper, FNP   HPI  R Knee Pain  Spring 2023, symptoms started with R knee, she saw Rene Kocher for a physical at that time. No prior dx of osteoarthritis or other knee injury or procedure. She is standing prolonged on concrete floors. Question if should wear brace or what to do. Has not taken medication. Prefers to avoid OTC pain meds, but can take ibuprofen if need as it helps more than Tylenol.  Worse with walking up and down stairs. She is using her knee more cautious. Some instability but has buckled or given-way Pain central kneecap above and below.  Some pain is worse initially some sore and stiff, AM Prolonged sitting or kneeling bothers her more. Improved with walking more and when gets moving feels better.      05/28/2022    4:28 PM 02/14/2022    2:08 PM 03/11/2021    9:46 AM  Depression screen PHQ 2/9  Decreased Interest 0 0 0  Down, Depressed, Hopeless 0 0 0  PHQ - 2 Score 0 0 0  Altered sleeping 0 1   Tired, decreased energy 0 0   Change in appetite 0 1   Feeling bad or failure about yourself  0 0   Trouble concentrating 0 0   Moving slowly or fidgety/restless 0 0   Suicidal thoughts 0 0   PHQ-9 Score 0 2   Difficult doing work/chores Not difficult at all Not difficult at all     Social History   Tobacco Use   Smoking status: Never   Smokeless tobacco: Never  Vaping Use   Vaping Use: Never used  Substance Use Topics   Alcohol use: Yes    Alcohol/week: 1.0 standard drink of alcohol    Types: 1 Standard drinks or equivalent per week    Comment: occasionally    Drug use: Never    Review of Systems Per HPI unless specifically indicated above     Objective:    BP (!) 164/88 (BP Location: Left Arm,  Cuff Size: Normal)   Pulse 78   Ht 5\' 7"  (1.702 m)   Wt 240 lb (108.9 kg)   SpO2 98%   BMI 37.59 kg/m   Wt Readings from Last 3 Encounters:  05/28/22 240 lb (108.9 kg)  02/14/22 240 lb (108.9 kg)  04/19/21 239 lb (108.4 kg)    Physical Exam Vitals and nursing note reviewed.  Constitutional:      General: She is not in acute distress.    Appearance: Normal appearance. She is well-developed. She is obese. She is not diaphoretic.     Comments: Well-appearing, comfortable, cooperative  HENT:     Head: Normocephalic and atraumatic.  Eyes:     General:        Right eye: No discharge.        Left eye: No discharge.     Conjunctiva/sclera: Conjunctivae normal.  Cardiovascular:     Rate and Rhythm: Normal rate.  Pulmonary:     Effort: Pulmonary effort is normal.  Musculoskeletal:     Comments: Right  Inspection: Bulky appearance and symmetrical. No ecchymosis or effusion. Palpation: Mild discomfort anterior, not over joint line. Fine crepitus. ROM: Full active ROM  bilaterally Special Testing: Lachman / Valgus/Varus tests negative with intact ligaments (ACL, MCL, LCL). Standing Thessaly test negative for provoked meniscus symptoms Strength: 5/5 intact knee flex/ext, ankle dorsi/plantarflex Neurovascular: distally intact sensation light touch and pulses   Skin:    General: Skin is warm and dry.     Findings: No erythema or rash.  Neurological:     Mental Status: She is alert and oriented to person, place, and time.  Psychiatric:        Mood and Affect: Mood normal.        Behavior: Behavior normal.        Thought Content: Thought content normal.     Comments: Well groomed, good eye contact, normal speech and thoughts    Results for orders placed or performed in visit on 02/13/22  Hepatitis C antibody  Result Value Ref Range   Hepatitis C Ab NON-REACTIVE NON-REACTIVE   SIGNAL TO CUT-OFF 0.43 <1.00  HIV Antibody (routine testing w rflx)  Result Value Ref Range   HIV 1&2  Ab, 4th Generation NON-REACTIVE NON-REACTIVE  Microalbumin / creatinine urine ratio  Result Value Ref Range   Creatinine, Urine 90 20 - 275 mg/dL   Microalb, Ur 0.6 mg/dL   Microalb Creat Ratio 7 <30 mcg/mg creat  Hemoglobin A1c  Result Value Ref Range   Hgb A1c MFr Bld 7.2 (H) <5.7 % of total Hgb   Mean Plasma Glucose 160 mg/dL   eAG (mmol/L) 8.9 mmol/L  Lipid panel  Result Value Ref Range   Cholesterol 153 <200 mg/dL   HDL 60 > OR = 50 mg/dL   Triglycerides 125 <150 mg/dL   LDL Cholesterol (Calc) 72 mg/dL (calc)   Total CHOL/HDL Ratio 2.6 <5.0 (calc)   Non-HDL Cholesterol (Calc) 93 <130 mg/dL (calc)  COMPLETE METABOLIC PANEL WITH GFR  Result Value Ref Range   Glucose, Bld 140 (H) 65 - 99 mg/dL   BUN 13 7 - 25 mg/dL   Creat 0.74 0.50 - 1.03 mg/dL   eGFR 98 > OR = 60 mL/min/1.45m2   BUN/Creatinine Ratio NOT APPLICABLE 6 - 22 (calc)   Sodium 137 135 - 146 mmol/L   Potassium 4.5 3.5 - 5.3 mmol/L   Chloride 103 98 - 110 mmol/L   CO2 26 20 - 32 mmol/L   Calcium 9.4 8.6 - 10.4 mg/dL   Total Protein 7.1 6.1 - 8.1 g/dL   Albumin 4.2 3.6 - 5.1 g/dL   Globulin 2.9 1.9 - 3.7 g/dL (calc)   AG Ratio 1.4 1.0 - 2.5 (calc)   Total Bilirubin 0.4 0.2 - 1.2 mg/dL   Alkaline phosphatase (APISO) 75 37 - 153 U/L   AST 21 10 - 35 U/L   ALT 31 (H) 6 - 29 U/L  CBC  Result Value Ref Range   WBC 8.3 3.8 - 10.8 Thousand/uL   RBC 5.22 (H) 3.80 - 5.10 Million/uL   Hemoglobin 14.8 11.7 - 15.5 g/dL   HCT 45.9 (H) 35.0 - 45.0 %   MCV 87.9 80.0 - 100.0 fL   MCH 28.4 27.0 - 33.0 pg   MCHC 32.2 32.0 - 36.0 g/dL   RDW 12.5 11.0 - 15.0 %   Platelets 234 140 - 400 Thousand/uL   MPV 9.5 7.5 - 12.5 fL      Assessment & Plan:   Problem List Items Addressed This Visit   None Visit Diagnoses     Chronic pain of right knee    -  Primary  Relevant Orders   DG Knee Complete 4 Views Right      Subacute on chronic R central vs lateral without swelling. without known injury or trauma  No prior  known OA DJD No prior imaging Risk factors with obesity, prolonged standing - Able to bear weight, questionable if has knee instability, no mechanical locking - No prior history of knee surgery, arthroscopy - Inadequate conservative therapy   Plan: 1. Start anti-inflammatory trial with topical Voltaren 2-4 times a day 2. Start Tylenol 500-$RemoveBeforeDEI'1000mg'[id number]$  per dose TID PRN breakthrough 3. RICE therapy (rest, ice, compression, elevation) for swelling, activity modification - Knee sleeve recommended 4. Knee x-rays ordered today for baseline, eval OA 5. Follow-up 2-4 weeks if improving gradual consider refer to PT, if not improving or worse consider Ortho vs Sports Med.   Orders Placed This Encounter  Procedures   DG Knee Complete 4 Views Right    Standing Status:   Future    Number of Occurrences:   1    Standing Expiration Date:   05/29/2023    Order Specific Question:   Reason for Exam (SYMPTOM  OR DIAGNOSIS REQUIRED)    Answer:   right knee pain, anterior, patellar    Order Specific Question:   Is patient pregnant?    Answer:   No    Order Specific Question:   Preferred imaging location?    Answer:   [facility]      No orders of the defined types were placed in this encounter.     Follow up plan: Return in about 4 weeks (around 06/25/2022), or if symptoms worsen or fail to improve, for 4 weeks as needed R knee pain.   Nobie Putnam, [facility] 05/28/2022, 4:34 PM

## 2022-06-17 LAB — HM DIABETES EYE EXAM

## 2022-08-11 ENCOUNTER — Other Ambulatory Visit: Payer: Self-pay | Admitting: Internal Medicine

## 2022-08-11 DIAGNOSIS — I10 Essential (primary) hypertension: Secondary | ICD-10-CM

## 2022-08-12 NOTE — Telephone Encounter (Signed)
Requested Prescriptions  Pending Prescriptions Disp Refills  . lisinopril (ZESTRIL) 10 MG tablet [Pharmacy Med Name: LISINOPRIL '10MG'$  TABLETS] 30 tablet 0    Sig: TAKE 1 TABLET(10 MG) BY MOUTH DAILY     Cardiovascular:  ACE Inhibitors Failed - 08/11/2022  6:36 PM      Failed - Last BP in normal range    BP Readings from Last 1 Encounters:  05/28/22 (!) 164/88         Passed - Cr in normal range and within 180 days    Creat  Date Value Ref Range Status  02/13/2022 0.74 0.50 - 1.03 mg/dL Final   Creatinine, Urine  Date Value Ref Range Status  02/13/2022 90 20 - 275 mg/dL Final         Passed - K in normal range and within 180 days    Potassium  Date Value Ref Range Status  02/13/2022 4.5 3.5 - 5.3 mmol/L Final  06/02/2012 3.9 3.5 - 5.1 mmol/L Final         Passed - Patient is not pregnant      Passed - Valid encounter within last 6 months    Recent Outpatient Visits          2 months ago Chronic pain of right knee   [facility] Olin Hauser, DO   5 months ago Encounter for general adult medical examination with abnormal findings   [facility] [place], Coralie Keens, NP   1 year ago Class 2 severe obesity due to excess calories with serious comorbidity and body mass index (BMI) of 36.0 to 36.9 in adult [facility])   [facility], Coralie Keens, NP   1 year ago Annual physical exam   [facility] [place], [place], [place]   1 year ago Type 2 diabetes mellitus with hyperglycemia, without long-term current use of insulin [facility])   [facility], Lupita Raider, [place]

## 2022-10-20 ENCOUNTER — Other Ambulatory Visit: Payer: Self-pay | Admitting: Internal Medicine

## 2022-10-20 DIAGNOSIS — I10 Essential (primary) hypertension: Secondary | ICD-10-CM

## 2022-10-20 NOTE — Telephone Encounter (Signed)
Medication Refill - Medication: lisinopril (ZESTRIL) 10 MG tablet [[id number]]   Pt ran out of medication.   Has the patient contacted their pharmacy? Yes.   (Agent: If no, request that the patient contact the pharmacy for the refill. If patient does not wish to contact the pharmacy document the reason why and proceed with request.) (Agent: If yes, when and what did the pharmacy advise?)  Preferred Pharmacy (with phone number or street name): [facility] [place], [place] - [address] [place], [place] [address] Phone: [phone]  Fax: [phone]    Has the patient been seen for an appointment in the last year OR does the patient have an upcoming appointment? Yes.    Agent: Please be advised that RX refills may take up to 3 business days. We ask that you follow-up with your pharmacy.

## 2022-10-21 NOTE — Telephone Encounter (Signed)
Requested medication (s) are due for refill today:yes  Requested medication (s) are on the active medication list: yes  Last refill:  08/12/22  Future visit scheduled: no  Notes to clinic:  Unable to refill per protocol, courtesy refill already given, routing for provider approval.      Requested Prescriptions  Pending Prescriptions Disp Refills   lisinopril (ZESTRIL) 10 MG tablet 30 tablet 0     Cardiovascular:  ACE Inhibitors Failed - 10/20/2022  4:50 PM      Failed - Cr in normal range and within 180 days    Creat  Date Value Ref Range Status  02/13/2022 0.74 0.50 - 1.03 mg/dL Final   Creatinine, Urine  Date Value Ref Range Status  02/13/2022 90 20 - 275 mg/dL Final         Failed - K in normal range and within 180 days    Potassium  Date Value Ref Range Status  02/13/2022 4.5 3.5 - 5.3 mmol/L Final  06/02/2012 3.9 3.5 - 5.1 mmol/L Final         Failed - Last BP in normal range    BP Readings from Last 1 Encounters:  05/28/22 (!) 164/88         Failed - Valid encounter within last 6 months    Recent Outpatient Visits           4 months ago Chronic pain of right knee   [facility] Olin Hauser, DO   8 months ago Encounter for general adult medical examination with abnormal findings   [facility], Coralie Keens, NP   1 year ago Class 2 severe obesity due to excess calories with serious comorbidity and body mass index (BMI) of 36.0 to 36.9 in adult [facility])   [facility], Coralie Keens, NP   1 year ago Annual physical exam   [facility] [place], [place], [place]   2 years ago Type 2 diabetes mellitus with hyperglycemia, without long-term current use of insulin [facility])   [facility], Lupita Raider, [place]              Passed - Patient is not pregnant

## 2022-10-24 NOTE — Telephone Encounter (Signed)
Pt. Calling to ask if her lisinopril can be refilled. Please advise pt. Has been out of medication x 5 days.

## 2022-11-02 ENCOUNTER — Telehealth: Payer: Self-pay | Admitting: Internal Medicine

## 2022-11-02 DIAGNOSIS — I10 Essential (primary) hypertension: Secondary | ICD-10-CM

## 2022-11-04 MED ORDER — LISINOPRIL 10 MG PO TABS: 10.0000 mg | ORAL_TABLET | Freq: Every day | ORAL | 3 refills | Status: DC

## 2022-11-06 ENCOUNTER — Other Ambulatory Visit: Payer: Self-pay | Admitting: Internal Medicine

## 2022-11-06 DIAGNOSIS — E1165 Type 2 diabetes mellitus with hyperglycemia: Secondary | ICD-10-CM

## 2022-11-06 DIAGNOSIS — E785 Hyperlipidemia, unspecified: Secondary | ICD-10-CM

## 2022-11-06 NOTE — Telephone Encounter (Signed)
Requested Prescriptions  Pending Prescriptions Disp Refills   rosuvastatin (CRESTOR) 10 MG tablet [Pharmacy Med Name: ROSUVASTATIN '10MG'$  TABLETS] 90 tablet 0    Sig: TAKE 1 TABLET(10 MG) BY MOUTH DAILY     Cardiovascular:  Antilipid - Statins 2 Failed - 11/06/2022  3:21 AM      Failed - Lipid Panel in normal range within the last 12 months    Cholesterol  Date Value Ref Range Status  02/13/2022 153 <200 mg/dL Final   LDL Cholesterol (Calc)  Date Value Ref Range Status  02/13/2022 72 mg/dL (calc) Final    Comment:    Reference range: <100 . Desirable range <100 mg/dL for primary prevention;   <70 mg/dL for patients with CHD or diabetic patients  with > or = 2 CHD risk factors. Marland Kitchen LDL-C is now calculated using the Martin-Hopkins  calculation, which is a validated novel method providing  better accuracy than the Friedewald equation in the  estimation of LDL-C.  Cresenciano Genre et al. Annamaria Helling. 1017;510(25): 2061-2068  (http://education.[employer].com/faq/FAQ164)    HDL  Date Value Ref Range Status  02/13/2022 60 > OR = 50 mg/dL Final   Triglycerides  Date Value Ref Range Status  02/13/2022 125 <150 mg/dL Final         Passed - Cr in normal range and within 360 days    Creat  Date Value Ref Range Status  02/13/2022 0.74 0.50 - 1.03 mg/dL Final   Creatinine, Urine  Date Value Ref Range Status  02/13/2022 90 20 - 275 mg/dL Final         Passed - Patient is not pregnant      Passed - Valid encounter within last 12 months    Recent Outpatient Visits           5 months ago Chronic pain of right knee   [place], Devonne Doughty, DO   8 months ago Encounter for general adult medical examination with abnormal findings   [facility] [place], Coralie Keens, NP   1 year ago Class 2 severe obesity due to excess calories with serious comorbidity and body mass index (BMI) of 36.0 to 36.9 in adult [facility])   [facility], Coralie Keens, NP   1 year ago Annual physical exam   [facility] [place], [place], [place]   2 years ago Type 2 diabetes mellitus with hyperglycemia, without long-term current use of insulin [facility])   [facility], Lupita Raider, [place]

## 2022-11-10 NOTE — Telephone Encounter (Signed)
Pt called saying the pharmacy will not refill her lisinopril.  I see where it went in and told her that but she says when she calls they are telling her there is no refill.  CB#  [phone]

## 2022-11-11 MED ORDER — LISINOPRIL 10 MG PO TABS: 10.0000 mg | ORAL_TABLET | Freq: Every day | ORAL | 0 refills | Status: DC

## 2022-11-11 NOTE — Telephone Encounter (Signed)
RX sent to [facility].    Disp Refills Start End   lisinopril (ZESTRIL) 10 MG tablet 30 tablet 0 11/11/2022    Sig - Route: Take 1 tablet (10 mg total) by mouth daily. Please schedule an office visit before anymore refills. - Oral   Sent to pharmacy as: lisinopril (ZESTRIL) 10 MG tablet   E-Prescribing Status: Receipt confirmed by pharmacy (11/11/2022  3:21 PM EST)     Thanks,   Mickel Baas

## 2022-11-11 NOTE — Addendum Note (Signed)
Addended by: Ashley Royalty E on: 11/11/2022 03:22 PM   Modules accepted: Orders

## 2022-12-01 ENCOUNTER — Encounter: Payer: Self-pay | Admitting: Internal Medicine

## 2022-12-01 ENCOUNTER — Ambulatory Visit: Payer: BC Managed Care – PPO | Admitting: Internal Medicine

## 2022-12-01 VITALS — BP 124/86 | HR 79 | Temp 96.9°F | Wt 235.0 lb

## 2022-12-01 DIAGNOSIS — Z6836 Body mass index (BMI) 36.0-36.9, adult: Secondary | ICD-10-CM

## 2022-12-01 DIAGNOSIS — E1165 Type 2 diabetes mellitus with hyperglycemia: Secondary | ICD-10-CM | POA: Diagnosis not present

## 2022-12-01 DIAGNOSIS — E785 Hyperlipidemia, unspecified: Secondary | ICD-10-CM

## 2022-12-01 DIAGNOSIS — I10 Essential (primary) hypertension: Secondary | ICD-10-CM

## 2022-12-01 DIAGNOSIS — R053 Chronic cough: Secondary | ICD-10-CM

## 2022-12-01 LAB — POCT GLYCOSYLATED HEMOGLOBIN (HGB A1C): Hemoglobin A1C: 7.6 % — AB (ref 4.0–5.6)

## 2022-12-01 MED ORDER — OMEPRAZOLE 20 MG PO CPDR: 20.0000 mg | DELAYED_RELEASE_CAPSULE | Freq: Every day | ORAL | 0 refills | Status: DC

## 2022-12-01 NOTE — Progress Notes (Unsigned)
Subjective:    Patient ID: Elizabeth Dalton, female    DOB: 1971-05-03, 52 y.o.   MRN: [id number]  HPI  Patient presents to clinic today for follow-up of chronic conditions.  HTN: Her BP today is 124/86.  She is taking Lisinopril as prescribed.  She reports history of whitecoat syndrome but does not monitor her blood pressure at home.  ECG from 05/2012 reviewed.  DM2: Her last A1c was 7.2%, 02/2022.  She is taking Metformin as prescribed.  She does not check her sugars.  She checks her feet routinely.  Her last eye exam was 06/2022.  Flu 07/2021.  Pneumovax 02/2021.  COVID x 3.  HLD: Her last LDL was 72, triglycerides 125, 02/2022.  She denies myalgias on Rosuvastatin.  She tries to consume low-fat diet.  She also reports persistent cough.  This started.  Review of Systems  Past Medical History:  Diagnosis Date   Allergy     Current Outpatient Medications  Medication Sig Dispense Refill   blood glucose meter kit and supplies KIT Dispense based on patient and insurance preference. Use up to four times daily as directed. (FOR ICD-9 250.00, 250.01). 1 each 0   cetirizine (ZYRTEC) 10 MG tablet Take by mouth.     lisinopril (ZESTRIL) 10 MG tablet Take 1 tablet (10 mg total) by mouth daily. Please schedule an office visit before anymore refills. 30 tablet 0   metFORMIN (GLUCOPHAGE) 500 MG tablet TAKE 1 TABLET(500 MG) BY MOUTH TWICE DAILY WITH MEALS 180 tablet 1   MULTIPLE VITAMINS PO Take by mouth.     rosuvastatin (CRESTOR) 10 MG tablet TAKE 1 TABLET(10 MG) BY MOUTH DAILY 90 tablet 0   No current facility-administered medications for this visit.    Allergies  Allergen Reactions   Latex Rash    Family History  Problem Relation Age of Onset   Lung cancer Mother    Heart disease Mother    Hypertension Mother    Diabetes Mother        T1DM   Heart disease Father    Hypertension Father    Diabetes Sister    Diabetes Sister    Dementia Maternal Grandmother    Liver  disease Paternal Grandfather    Alcohol abuse Paternal Grandfather    Breast cancer Neg Hx     Social History   Socioeconomic History   Marital status: Married    Spouse name: Not on file   Number of children: Not on file   Years of education: Not on file   Highest education level: Bachelor's degree (e.g., BA, AB, BS)  Occupational History   Occupation: Psychologist, counselling: [employer]    Comment: Science  Tobacco Use   Smoking status: Never   Smokeless tobacco: Never  Vaping Use   Vaping Use: Never used  Substance and Sexual Activity   Alcohol use: Yes    Alcohol/week: 1.0 standard drink of alcohol    Types: 1 Standard drinks or equivalent per week    Comment: occasionally    Drug use: Never   Sexual activity: Yes  Other Topics Concern   Not on file  Social History Narrative   Not on file   Social Determinants of Health   Financial Resource Strain: Low Risk  (01/21/2019)   Overall Financial Resource Strain (CARDIA)    Difficulty of Paying Living Expenses: Not hard at all  Food Insecurity: No Food Insecurity (01/21/2019)   Hunger  Vital Sign    Worried About Charity fundraiser in the Last Year: Never true    Ran Out of Food in the Last Year: Never true  Transportation Needs: No Transportation Needs (01/21/2019)   PRAPARE - Hydrologist (Medical): No    Lack of Transportation (Non-Medical): No  Physical Activity: Not on file  Stress: Not on file  Social Connections: Unknown (01/21/2019)   Social Connection and Isolation Panel [NHANES]    Frequency of Communication with Friends and Family: Not on file    Frequency of Social Gatherings with Friends and Family: Not on file    Attends Religious Services: Not on file    Active Member of Clubs or Organizations: Not on file    Attends Archivist Meetings: Not on file    Marital Status: Married  Intimate Partner Violence: Not At Risk (01/21/2019)    Humiliation, Afraid, Rape, and Kick questionnaire    Fear of Current or Ex-Partner: No    Emotionally Abused: No    Physically Abused: No    Sexually Abused: No     Constitutional: Denies fever, malaise, fatigue, headache or abrupt weight changes.  HEENT: Denies eye pain, eye redness, ear pain, ringing in the ears, wax buildup, runny nose, nasal congestion, bloody nose, or sore throat. Respiratory: Patient reports cough.  Denies difficulty breathing, shortness of breath, or sputum production.   Cardiovascular: Denies chest pain, chest tightness, palpitations or swelling in the hands or feet.  Gastrointestinal: Denies abdominal pain, bloating, constipation, diarrhea or blood in the stool.  GU: Denies urgency, frequency, pain with urination, burning sensation, blood in urine, odor or discharge. Musculoskeletal: Denies decrease in range of motion, difficulty with gait, muscle pain or joint pain and swelling.  Skin: Denies redness, rashes, lesions or ulcercations.  Neurological: Denies dizziness, difficulty with memory, difficulty with speech or problems with balance and coordination.  Psych: Denies anxiety, depression, SI/HI.  No other specific complaints in a complete review of systems (except as listed in HPI above).     Objective:   Physical Exam   There were no vitals taken for this visit. Wt Readings from Last 3 Encounters:  05/28/22 240 lb (108.9 kg)  02/14/22 240 lb (108.9 kg)  04/19/21 239 lb (108.4 kg)    General: Appears their stated age, well developed, well nourished in NAD. Skin: Warm, dry and intact. No rashes, lesions or ulcerations noted. HEENT: Head: normal shape and size; Eyes: sclera white, no icterus, conjunctiva pink, PERRLA and EOMs intact; Ears: Tm's gray and intact, normal light reflex; Nose: mucosa pink and moist, septum midline; Throat/Mouth: Teeth present, mucosa pink and moist, no exudate, lesions or ulcerations noted.  Neck:  Neck supple, trachea midline.  No masses, lumps or thyromegaly present.  Cardiovascular: Normal rate and rhythm. S1,S2 noted.  No murmur, rubs or gallops noted. No JVD or BLE edema. No carotid bruits noted. Pulmonary/Chest: Normal effort and positive vesicular breath sounds. No respiratory distress. No wheezes, rales or ronchi noted.  Abdomen: Soft and nontender. Normal bowel sounds. No distention or masses noted. Liver, spleen and kidneys non palpable. Musculoskeletal: Normal range of motion. No signs of joint swelling. No difficulty with gait.  Neurological: Alert and oriented. Cranial nerves II-XII grossly intact. Coordination normal.  Psychiatric: Mood and affect normal. Behavior is normal. Judgment and thought content normal.     BMET    Component Value Date/Time   NA 137 02/13/2022 0811   NA 139  06/02/2012 1926   K 4.5 02/13/2022 0811   K 3.9 06/02/2012 1926   CL 103 02/13/2022 0811   CL 103 06/02/2012 1926   CO2 26 02/13/2022 0811   CO2 26 06/02/2012 1926   GLUCOSE 140 (H) 02/13/2022 0811   GLUCOSE 129 (H) 06/02/2012 1926   BUN 13 02/13/2022 0811   BUN 10 06/02/2012 1926   CREATININE 0.74 02/13/2022 0811   CALCIUM 9.4 02/13/2022 0811   CALCIUM 8.7 06/02/2012 1926   GFRNONAA 89 08/30/2020 1023   GFRAA 103 08/30/2020 1023    Lipid Panel     Component Value Date/Time   CHOL 153 02/13/2022 0811   TRIG 125 02/13/2022 0811   HDL 60 02/13/2022 0811   CHOLHDL 2.6 02/13/2022 0811   LDLCALC 72 02/13/2022 0811    CBC    Component Value Date/Time   WBC 8.3 02/13/2022 0811   RBC 5.22 (H) 02/13/2022 0811   HGB 14.8 02/13/2022 0811   HGB 14.6 06/02/2012 1926   HCT 45.9 (H) 02/13/2022 0811   HCT 42.6 06/02/2012 1926   PLT 234 02/13/2022 0811   PLT 250 06/02/2012 1926   MCV 87.9 02/13/2022 0811   MCV 85 06/02/2012 1926   MCH 28.4 02/13/2022 0811   MCHC 32.2 02/13/2022 0811   RDW 12.5 02/13/2022 0811   RDW 13.5 06/02/2012 1926   LYMPHSABS 2,936 08/30/2020 1023   LYMPHSABS 3.3 05/17/2012 0630    MONOABS 0.6 05/17/2012 0630   EOSABS 98 08/30/2020 1023   EOSABS 0.2 05/17/2012 0630   BASOSABS 41 08/30/2020 1023   BASOSABS 0.0 05/17/2012 0630    Hgb A1C Lab Results  Component Value Date   HGBA1C 7.2 (H) 02/13/2022           Assessment & Plan:    RTC in 6 months for annual exam Webb Silversmith, NP

## 2022-12-02 ENCOUNTER — Encounter: Payer: Self-pay | Admitting: Internal Medicine

## 2022-12-02 LAB — COMPLETE METABOLIC PANEL WITH GFR
AG Ratio: 1.4 (calc) (ref 1.0–2.5)
ALT: 63 U/L — ABNORMAL HIGH (ref 6–29)
AST: 44 U/L — ABNORMAL HIGH (ref 10–35)
Albumin: 4.3 g/dL (ref 3.6–5.1)
Alkaline phosphatase (APISO): 64 U/L (ref 37–153)
BUN: 11 mg/dL (ref 7–25)
CO2: 27 mmol/L (ref 20–32)
Calcium: 9.2 mg/dL (ref 8.6–10.4)
Chloride: 102 mmol/L (ref 98–110)
Creat: 0.74 mg/dL (ref 0.50–1.03)
Globulin: 3.1 g/dL (calc) (ref 1.9–3.7)
Glucose, Bld: 117 mg/dL — ABNORMAL HIGH (ref 65–99)
Potassium: 4.3 mmol/L (ref 3.5–5.3)
Sodium: 138 mmol/L (ref 135–146)
Total Bilirubin: 0.5 mg/dL (ref 0.2–1.2)
Total Protein: 7.4 g/dL (ref 6.1–8.1)
eGFR: 98 mL/min/{1.73_m2} (ref >=60)

## 2022-12-02 LAB — CBC
HCT: 41.8 % (ref 35.0–45.0)
Hemoglobin: 14.2 g/dL (ref 11.7–15.5)
MCH: 28.3 pg (ref 27.0–33.0)
MCHC: 34 g/dL (ref 32.0–36.0)
MCV: 83.3 fL (ref 80.0–100.0)
MPV: 9.9 fL (ref 7.5–12.5)
Platelets: 230 10*3/uL (ref 140–400)
RBC: 5.02 10*6/uL (ref 3.80–5.10)
RDW: 13 % (ref 11.0–15.0)
WBC: 10.1 10*3/uL (ref 3.8–10.8)

## 2022-12-02 LAB — LIPID PANEL
Cholesterol: 155 mg/dL (ref <200)
HDL: 50 mg/dL (ref >=50)
LDL Cholesterol (Calc): 83 mg/dL (calc)
Non-HDL Cholesterol (Calc): 105 mg/dL (calc) (ref <130)
Total CHOL/HDL Ratio: 3.1 (calc) (ref <5.0)
Triglycerides: 122 mg/dL (ref <150)

## 2022-12-02 NOTE — Assessment & Plan Note (Signed)
Encourage diet and exercise for weight loss 

## 2022-12-02 NOTE — Assessment & Plan Note (Signed)
POCT A1c 7.6 Urine microalbumin has been checked within the last year Continue metformin Encourage low-carb diet and exercise for weight loss Encourage routine eye exam Encourage routine foot exam She declines flu shot today Pneumovax UTD Encouraged her to get her COVID booster

## 2022-12-02 NOTE — Patient Instructions (Signed)

## 2022-12-02 NOTE — Assessment & Plan Note (Signed)
C-Met and lipid profile today Encouraged her to consume low-fat diet Continue rosuvastatin 

## 2022-12-02 NOTE — Assessment & Plan Note (Signed)
Controlled on lisinopril however she has experienced a cough If does not improve after treatment with PPI, consider changing lisinopril to amlodipine Reinforced DASH diet and exercise for weight loss C-Met today

## 2023-01-16 ENCOUNTER — Other Ambulatory Visit: Payer: Self-pay | Admitting: Internal Medicine

## 2023-01-16 DIAGNOSIS — E1165 Type 2 diabetes mellitus with hyperglycemia: Secondary | ICD-10-CM

## 2023-01-16 NOTE — Telephone Encounter (Signed)
Last labs 12/01/22 WNL.  Requested Prescriptions  Pending Prescriptions Disp Refills   metFORMIN (GLUCOPHAGE) 500 MG tablet [Pharmacy Med Name: METFORMIN 500MG  TABLETS] 180 tablet 0    Sig: TAKE 1 TABLET(500 MG) BY MOUTH TWICE DAILY WITH MEALS     Endocrinology:  Diabetes - Biguanides Failed - 01/16/2023  3:21 AM      Failed - B12 Level in normal range and within 720 days    No results found for: "VITAMINB12"       Failed - CBC within normal limits and completed in the last 12 months    WBC  Date Value Ref Range Status  12/01/2022 10.1 3.8 - 10.8 Thousand/uL Final   RBC  Date Value Ref Range Status  12/01/2022 5.02 3.80 - 5.10 Million/uL Final   Hemoglobin  Date Value Ref Range Status  12/01/2022 14.2 11.7 - 15.5 g/dL Final   HGB  Date Value Ref Range Status  06/02/2012 14.6 12.0 - 16.0 g/dL Final   HCT  Date Value Ref Range Status  12/01/2022 41.8 35.0 - 45.0 % Final  06/02/2012 42.6 35.0 - 47.0 % Final   MCHC  Date Value Ref Range Status  12/01/2022 34.0 32.0 - 36.0 g/dL Final   [facility]  Date Value Ref Range Status  12/01/2022 28.3 27.0 - 33.0 pg Final   MCV  Date Value Ref Range Status  12/01/2022 83.3 80.0 - 100.0 fL Final  06/02/2012 85 80 - 100 fL Final   No results found for: "PLTCOUNTKUC", "LABPLAT", "POCPLA" RDW  Date Value Ref Range Status  12/01/2022 13.0 11.0 - 15.0 % Final  06/02/2012 13.5 11.5 - 14.5 % Final         Passed - Cr in normal range and within 360 days    Creat  Date Value Ref Range Status  12/01/2022 0.74 0.50 - 1.03 mg/dL Final   Creatinine, Urine  Date Value Ref Range Status  02/13/2022 90 20 - 275 mg/dL Final         Passed - HBA1C is between 0 and 7.9 and within 180 days    Hemoglobin A1C  Date Value Ref Range Status  12/01/2022 7.6 (A) 4.0 - 5.6 % Final   Hgb A1c MFr Bld  Date Value Ref Range Status  02/13/2022 7.2 (H) <5.7 % of total Hgb Final    Comment:    For someone without known diabetes, a hemoglobin A1c value  of 6.5% or greater indicates that they may have  diabetes and this should be confirmed with a follow-up  test. . For someone with known diabetes, a value <7% indicates  that their diabetes is well controlled and a value  greater than or equal to 7% indicates suboptimal  control. A1c targets should be individualized based on  duration of diabetes, age, comorbid conditions, and  other considerations. . Currently, no consensus exists regarding use of hemoglobin A1c for diagnosis of diabetes for children. .          Passed - eGFR in normal range and within 360 days    GFR, Est African American  Date Value Ref Range Status  08/30/2020 103 > OR = 60 mL/min/1.81m2 Final   GFR, Est Non African American  Date Value Ref Range Status  08/30/2020 89 > OR = 60 mL/min/1.69m2 Final   eGFR  Date Value Ref Range Status  12/01/2022 98 > OR = 60 mL/min/1.51m2 Final         Passed - Valid encounter within last  6 months    Recent Outpatient Visits           1 month ago Type 2 diabetes mellitus with hyperglycemia, without long-term current use of insulin [facility])   [facility] [place], Coralie Keens, NP   7 months ago Chronic pain of right knee   [facility] Olin Hauser, DO   11 months ago Encounter for general adult medical examination with abnormal findings   [facility] [place], [place], NP   1 year ago Class 2 severe obesity due to excess calories with serious comorbidity and body mass index (BMI) of 36.0 to 36.9 in adult [facility])   [facility] [place], Coralie Keens, NP   1 year ago Annual physical exam   [facility] [place], Fabio Bering [place], [place]

## 2023-01-25 ENCOUNTER — Other Ambulatory Visit: Payer: Self-pay | Admitting: Internal Medicine

## 2023-01-25 DIAGNOSIS — I10 Essential (primary) hypertension: Secondary | ICD-10-CM

## 2023-01-26 NOTE — Telephone Encounter (Signed)
Requested Prescriptions  Pending Prescriptions Disp Refills   lisinopril (ZESTRIL) 10 MG tablet [Pharmacy Med Name: LISINOPRIL 10MG  TABLETS] 90 tablet 1    Sig: TAKE 1 TABLET(10 MG) BY MOUTH DAILY     Cardiovascular:  ACE Inhibitors Passed - 01/25/2023  9:52 AM      Passed - Cr in normal range and within 180 days    Creat  Date Value Ref Range Status  12/01/2022 0.74 0.50 - 1.03 mg/dL Final   Creatinine, Urine  Date Value Ref Range Status  02/13/2022 90 20 - 275 mg/dL Final         Passed - K in normal range and within 180 days    Potassium  Date Value Ref Range Status  12/01/2022 4.3 3.5 - 5.3 mmol/L Final  06/02/2012 3.9 3.5 - 5.1 mmol/L Final         Passed - Patient is not pregnant      Passed - Last BP in normal range    BP Readings from Last 1 Encounters:  12/01/22 124/86         Passed - Valid encounter within last 6 months    Recent Outpatient Visits           1 month ago Type 2 diabetes mellitus with hyperglycemia, without long-term current use of insulin [facility])   [facility] [place], Coralie Keens, NP   8 months ago Chronic pain of right knee   [facility] [place], Devonne Doughty, DO   11 months ago Encounter for general adult medical examination with abnormal findings   [facility] [place], [place], NP   1 year ago Class 2 severe obesity due to excess calories with serious comorbidity and body mass index (BMI) of 36.0 to 36.9 in adult [facility])   [facility] [place], Coralie Keens, NP   1 year ago Annual physical exam   [facility] [place], Fabio Bering [place], [place]

## 2023-04-18 ENCOUNTER — Other Ambulatory Visit: Payer: Self-pay | Admitting: Internal Medicine

## 2023-04-18 DIAGNOSIS — E1165 Type 2 diabetes mellitus with hyperglycemia: Secondary | ICD-10-CM

## 2023-04-20 ENCOUNTER — Encounter: Payer: Self-pay | Admitting: Internal Medicine

## 2023-04-20 ENCOUNTER — Ambulatory Visit (INDEPENDENT_AMBULATORY_CARE_PROVIDER_SITE_OTHER): Payer: BC Managed Care – PPO | Admitting: Internal Medicine

## 2023-04-20 VITALS — BP 134/82 | HR 84 | Temp 97.1°F | Ht 67.5 in | Wt 236.0 lb

## 2023-04-20 DIAGNOSIS — I10 Essential (primary) hypertension: Secondary | ICD-10-CM

## 2023-04-20 DIAGNOSIS — E785 Hyperlipidemia, unspecified: Secondary | ICD-10-CM

## 2023-04-20 DIAGNOSIS — E1165 Type 2 diabetes mellitus with hyperglycemia: Secondary | ICD-10-CM | POA: Diagnosis not present

## 2023-04-20 DIAGNOSIS — Z0001 Encounter for general adult medical examination with abnormal findings: Secondary | ICD-10-CM | POA: Diagnosis not present

## 2023-04-20 DIAGNOSIS — Z1231 Encounter for screening mammogram for malignant neoplasm of breast: Secondary | ICD-10-CM

## 2023-04-20 DIAGNOSIS — Z6836 Body mass index (BMI) 36.0-36.9, adult: Secondary | ICD-10-CM

## 2023-04-20 LAB — CBC
HCT: 44.5 % (ref 35.0–45.0)
MCV: 85.4 fL (ref 80.0–100.0)
RBC: 5.21 10*6/uL — ABNORMAL HIGH (ref 3.80–5.10)

## 2023-04-20 MED ORDER — LISINOPRIL 10 MG PO TABS
ORAL_TABLET | ORAL | 1 refills | Status: DC
Start: 2023-04-20 — End: 2023-10-26

## 2023-04-20 MED ORDER — METFORMIN HCL 500 MG PO TABS
ORAL_TABLET | ORAL | 1 refills | Status: DC
Start: 2023-04-20 — End: 2023-06-08

## 2023-04-20 MED ORDER — ROSUVASTATIN CALCIUM 10 MG PO TABS
ORAL_TABLET | ORAL | 1 refills | Status: AC
Start: 2023-04-20 — End: ?

## 2023-04-20 NOTE — Telephone Encounter (Signed)
Requested Prescriptions  Pending Prescriptions Disp Refills   metFORMIN (GLUCOPHAGE) 500 MG tablet [Pharmacy Med Name: METFORMIN 500MG  TABLETS] 180 tablet 0    Sig: TAKE 1 TABLET(500 MG) BY MOUTH TWICE DAILY WITH MEALS     Endocrinology:  Diabetes - Biguanides Failed - 04/18/2023  9:39 AM      Failed - B12 Level in normal range and within 720 days    No results found for: "VITAMINB12"       Failed - CBC within normal limits and completed in the last 12 months    WBC  Date Value Ref Range Status  12/01/2022 10.1 3.8 - 10.8 Thousand/uL Final   RBC  Date Value Ref Range Status  12/01/2022 5.02 3.80 - 5.10 Million/uL Final   Hemoglobin  Date Value Ref Range Status  12/01/2022 14.2 11.7 - 15.5 g/dL Final   HGB  Date Value Ref Range Status  06/02/2012 14.6 12.0 - 16.0 g/dL Final   HCT  Date Value Ref Range Status  12/01/2022 41.8 35.0 - 45.0 % Final  06/02/2012 42.6 35.0 - 47.0 % Final   MCHC  Date Value Ref Range Status  12/01/2022 34.0 32.0 - 36.0 g/dL Final   [facility]  Date Value Ref Range Status  12/01/2022 28.3 27.0 - 33.0 pg Final   MCV  Date Value Ref Range Status  12/01/2022 83.3 80.0 - 100.0 fL Final  06/02/2012 85 80 - 100 fL Final   No results found for: "PLTCOUNTKUC", "LABPLAT", "POCPLA" RDW  Date Value Ref Range Status  12/01/2022 13.0 11.0 - 15.0 % Final  06/02/2012 13.5 11.5 - 14.5 % Final         Passed - Cr in normal range and within 360 days    Creat  Date Value Ref Range Status  12/01/2022 0.74 0.50 - 1.03 mg/dL Final   Creatinine, Urine  Date Value Ref Range Status  02/13/2022 90 20 - 275 mg/dL Final         Passed - HBA1C is between 0 and 7.9 and within 180 days    Hemoglobin A1C  Date Value Ref Range Status  12/01/2022 7.6 (A) 4.0 - 5.6 % Final   Hgb A1c MFr Bld  Date Value Ref Range Status  02/13/2022 7.2 (H) <5.7 % of total Hgb Final    Comment:    For someone without known diabetes, a hemoglobin A1c value of 6.5% or greater  indicates that they may have  diabetes and this should be confirmed with a follow-up  test. . For someone with known diabetes, a value <7% indicates  that their diabetes is well controlled and a value  greater than or equal to 7% indicates suboptimal  control. A1c targets should be individualized based on  duration of diabetes, age, comorbid conditions, and  other considerations. . Currently, no consensus exists regarding use of hemoglobin A1c for diagnosis of diabetes for children. .          Passed - eGFR in normal range and within 360 days    GFR, Est African American  Date Value Ref Range Status  08/30/2020 103 > OR = 60 mL/min/1.10m2 Final   GFR, Est Non African American  Date Value Ref Range Status  08/30/2020 89 > OR = 60 mL/min/1.31m2 Final   eGFR  Date Value Ref Range Status  12/01/2022 98 > OR = 60 mL/min/1.26m2 Final         Passed - Valid encounter within last 6 months  Recent Outpatient Visits           4 months ago Type 2 diabetes mellitus with hyperglycemia, without long-term current use of insulin [facility])   [facility] [place], [place], NP   10 months ago Chronic pain of right knee   [facility] Smitty Cords, DO   1 year ago Encounter for general adult medical examination with abnormal findings   [place] [facility] [place], Salvadore Oxford, NP   2 years ago Class 2 severe obesity due to excess calories with serious comorbidity and body mass index (BMI) of 36.0 to 36.9 in adult [facility])   [place] [facility] [place], Salvadore Oxford, NP   2 years ago Annual physical exam   [facility] [place], Lavella [place], [place]       Future Appointments             Today Sam[place] Si, Salvadore Oxford, NP [facility], [facility]

## 2023-04-20 NOTE — Assessment & Plan Note (Signed)
Encourage diet and exercise for weight loss 

## 2023-04-20 NOTE — Progress Notes (Signed)
Subjective:    Patient ID: Elizabeth Dalton, female    DOB: 1971-06-04, 52 y.o.   MRN: [id number]  HPI  Pt presents to the clinic today for his annual exam.  Flu: 07/2021 Tetanus: 04/2018 Pneumovax: 02/2021 Covid: Moderna x 3 Shingrix: never Pap smear: 02/2021 Mammogram: 04/2022 Colon screening: 04/2021 Vision screening: annually Dentist: biannually  Diet: She does eat meat. She consumes fruits and veggies. She does eat fried foods. She drinks mostly water and coffee. Exercise: None    Review of Systems     Past Medical History:  Diagnosis Date   Allergy     Current Outpatient Medications  Medication Sig Dispense Refill   blood glucose meter kit and supplies KIT Dispense based on patient and insurance preference. Use up to four times daily as directed. (FOR ICD-9 250.00, 250.01). 1 each 0   cetirizine (ZYRTEC) 10 MG tablet Take by mouth.     lisinopril (ZESTRIL) 10 MG tablet TAKE 1 TABLET(10 MG) BY MOUTH DAILY 90 tablet 1   metFORMIN (GLUCOPHAGE) 500 MG tablet TAKE 1 TABLET(500 MG) BY MOUTH TWICE DAILY WITH MEALS 180 tablet 0   MULTIPLE VITAMINS PO Take by mouth.     omeprazole (PRILOSEC) 20 MG capsule Take 1 capsule (20 mg total) by mouth daily. 14 capsule 0   rosuvastatin (CRESTOR) 10 MG tablet TAKE 1 TABLET(10 MG) BY MOUTH DAILY 90 tablet 0   No current facility-administered medications for this visit.    Allergies  Allergen Reactions   Latex Rash    Family History  Problem Relation Age of Onset   Lung cancer Mother    Heart disease Mother    Hypertension Mother    Diabetes Mother        T1DM   Heart disease Father    Hypertension Father    Diabetes Sister    Diabetes Sister    Dementia Maternal Grandmother    Liver disease Paternal Grandfather    Alcohol abuse Paternal Grandfather    Breast cancer Neg Hx     Social History   Socioeconomic History   Marital status: Married    Spouse name: Not on file   Number of children: Not on file    Years of education: Not on file   Highest education level: Bachelor's degree (e.g., BA, AB, BS)  Occupational History   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  Tobacco Use   Smoking status: Never   Smokeless tobacco: Never  Vaping Use   Vaping Use: Never used  Substance and Sexual Activity   Alcohol use: Yes    Alcohol/week: 1.0 standard drink of alcohol    Types: 1 Standard drinks or equivalent per week    Comment: occasionally    Drug use: Never   Sexual activity: Yes  Other Topics Concern   Not on file  Social History Narrative   Not on file   Social Determinants of Health   Financial Resource Strain: Low Risk  (01/21/2019)   Overall Financial Resource Strain (CARDIA)    Difficulty of Paying Living Expenses: Not hard at all  Food Insecurity: No Food Insecurity (01/21/2019)   Hunger Vital Sign    Worried About Running Out of Food in the Last Year: Never true    Ran Out of Food in the Last Year: Never true  Transportation Needs: No Transportation Needs (01/21/2019)   PRAPARE - Transportation    Lack of Transportation (  Medical): No    Lack of Transportation (Non-Medical): No  Physical Activity: Not on file  Stress: Not on file  Social Connections: Unknown (01/21/2019)   Social Connection and Isolation Panel [NHANES]    Frequency of Communication with Friends and Family: Not on file    Frequency of Social Gatherings with Friends and Family: Not on file    Attends Religious Services: Not on file    Active Member of Clubs or Organizations: Not on file    Attends Banker Meetings: Not on file    Marital Status: Married  Intimate Partner Violence: Not At Risk (01/21/2019)   Humiliation, Afraid, Rape, and Kick questionnaire    Fear of Current or Ex-Partner: No    Emotionally Abused: No    Physically Abused: No    Sexually Abused: No     Constitutional: Denies fever, malaise, fatigue, headache or abrupt  weight changes.  HEENT: Denies eye pain, eye redness, ear pain, ringing in the ears, wax buildup, runny nose, nasal congestion, bloody nose, or sore throat. Respiratory: Denies difficulty breathing, shortness of breath, cough or sputum production.   Cardiovascular: Denies chest pain, chest tightness, palpitations or swelling in the hands or feet.  Gastrointestinal: Denies abdominal pain, bloating, constipation, diarrhea or blood in the stool.  GU: Denies urgency, frequency, pain with urination, burning sensation, blood in urine, odor or discharge. Musculoskeletal: Denies decrease in range of motion, difficulty with gait, muscle pain or joint pain and swelling.  Skin: Denies redness, rashes, lesions or ulcercations.  Neurological: Denies dizziness, difficulty with memory, difficulty with speech or problems with balance and coordination.  Psych: Denies anxiety, depression, SI/HI.  No other specific complaints in a complete review of systems (except as listed in HPI above).  Objective:   Physical Exam  BP 134/82 (BP Location: Right Arm, Patient Position: Sitting, Cuff Size: Large)   Pulse 84   Temp (!) 97.1 F (36.2 C) (Temporal)   Ht 5' 7.5" (1.715 m)   Wt 236 lb (107 kg)   SpO2 94%   BMI 36.42 kg/m   Wt Readings from Last 3 Encounters:  12/01/22 235 lb (106.6 kg)  05/28/22 240 lb (108.9 kg)  02/14/22 240 lb (108.9 kg)    General: Appears her stated age, obese, in NAD. Skin: Warm, dry and intact. No ulcerations noted. HEENT: Head: normal shape and size; Eyes: sclera white, no icterus, conjunctiva pink, PERRLA and EOMs intact;  Neck:  Neck supple, trachea midline. No masses, lumps or thyromegaly present.  Cardiovascular: Normal rate and rhythm. S1,S2 noted.  No murmur, rubs or gallops noted. No JVD or BLE edema.  Varicose veins of BLE.  No carotid bruits noted. Pulmonary/Chest: Normal effort and positive vesicular breath sounds. No respiratory distress. No wheezes, rales or ronchi  noted.  Abdomen:  Normal bowel sounds.  Musculoskeletal: Strength 5/5 BUE/BLE.  No difficulty with gait.  Neurological: Alert and oriented. Cranial nerves II-XII grossly intact. Coordination normal.  Psychiatric: Mood and affect normal. Behavior is normal. Judgment and thought content normal.     BMET    Component Value Date/Time   NA 138 12/01/2022 1550   NA 139 06/02/2012 1926   K 4.3 12/01/2022 1550   K 3.9 06/02/2012 1926   CL 102 12/01/2022 1550   CL 103 06/02/2012 1926   CO2 27 12/01/2022 1550   CO2 26 06/02/2012 1926   GLUCOSE 117 (H) 12/01/2022 1550   GLUCOSE 129 (H) 06/02/2012 1926   BUN 11 12/01/2022 1550  BUN 10 06/02/2012 1926   CREATININE 0.74 12/01/2022 1550   CALCIUM 9.2 12/01/2022 1550   CALCIUM 8.7 06/02/2012 1926   GFRNONAA 89 08/30/2020 1023   GFRAA 103 08/30/2020 1023    Lipid Panel     Component Value Date/Time   CHOL 155 12/01/2022 1550   TRIG 122 12/01/2022 1550   HDL 50 12/01/2022 1550   CHOLHDL 3.1 12/01/2022 1550   LDLCALC 83 12/01/2022 1550    CBC    Component Value Date/Time   WBC 10.1 12/01/2022 1550   RBC 5.02 12/01/2022 1550   HGB 14.2 12/01/2022 1550   HGB 14.6 06/02/2012 1926   HCT 41.8 12/01/2022 1550   HCT 42.6 06/02/2012 1926   PLT 230 12/01/2022 1550   PLT 250 06/02/2012 1926   MCV 83.3 12/01/2022 1550   MCV 85 06/02/2012 1926   MCH 28.3 12/01/2022 1550   MCHC 34.0 12/01/2022 1550   RDW 13.0 12/01/2022 1550   RDW 13.5 06/02/2012 1926   LYMPHSABS 2,936 08/30/2020 1023   LYMPHSABS 3.3 05/17/2012 0630   MONOABS 0.6 05/17/2012 0630   EOSABS 98 08/30/2020 1023   EOSABS 0.2 05/17/2012 0630   BASOSABS 41 08/30/2020 1023   BASOSABS 0.0 05/17/2012 0630    Hgb A1C Lab Results  Component Value Date   HGBA1C 7.6 (A) 12/01/2022            Assessment & Plan:   Preventative health maintenance:  Encouraged her to get a flu shot in the fall Tetanus UTD Pneumovax UTD Encouraged her to get her covid  booster Discussed shingrix vaccine, she will check coverage with her insurance company and schedule a visit if she would like to have this done Pap smear UTD Mammogram ordered, she will call to schedule Colon screening UTD Encouraged her to consume a balanced diet and exercise regimen Advised her to see an eye doctor and dentist annually Will check CBC, CMET, Lipid, A1C, urine microalbumin today  RTC in 6 months, follow up chronic conditions Nicki Reaper, NP

## 2023-04-21 LAB — HEMOGLOBIN A1C
Hgb A1c MFr Bld: 7.8 % of total Hgb — ABNORMAL HIGH (ref <5.7)
Mean Plasma Glucose: 177 mg/dL
eAG (mmol/L): 9.8 mmol/L

## 2023-04-21 LAB — CBC
Hemoglobin: 14.7 g/dL (ref 11.7–15.5)
MCH: 28.2 pg (ref 27.0–33.0)
MCHC: 33 g/dL (ref 32.0–36.0)
MPV: 10 fL (ref 7.5–12.5)
Platelets: 225 10*3/uL (ref 140–400)
RDW: 12.9 % (ref 11.0–15.0)
WBC: 10.6 10*3/uL (ref 3.8–10.8)

## 2023-04-21 LAB — COMPLETE METABOLIC PANEL WITH GFR
AG Ratio: 1.4 (calc) (ref 1.0–2.5)
ALT: 44 U/L — ABNORMAL HIGH (ref 6–29)
AST: 30 U/L (ref 10–35)
Albumin: 4.3 g/dL (ref 3.6–5.1)
Alkaline phosphatase (APISO): 72 U/L (ref 37–153)
BUN: 14 mg/dL (ref 7–25)
CO2: 29 mmol/L (ref 20–32)
Calcium: 10 mg/dL (ref 8.6–10.4)
Chloride: 101 mmol/L (ref 98–110)
Creat: 0.76 mg/dL (ref 0.50–1.03)
Globulin: 3 g/dL (calc) (ref 1.9–3.7)
Glucose, Bld: 153 mg/dL — ABNORMAL HIGH (ref 65–139)
Potassium: 4.6 mmol/L (ref 3.5–5.3)
Sodium: 138 mmol/L (ref 135–146)
Total Bilirubin: 0.5 mg/dL (ref 0.2–1.2)
Total Protein: 7.3 g/dL (ref 6.1–8.1)
eGFR: 94 mL/min/{1.73_m2} (ref >=60)

## 2023-04-21 LAB — LIPID PANEL
Cholesterol: 186 mg/dL (ref <200)
HDL: 55 mg/dL (ref >=50)
LDL Cholesterol (Calc): 95 mg/dL (calc)
Non-HDL Cholesterol (Calc): 131 mg/dL (calc) — ABNORMAL HIGH (ref <130)
Total CHOL/HDL Ratio: 3.4 (calc) (ref <5.0)
Triglycerides: 239 mg/dL — ABNORMAL HIGH (ref <150)

## 2023-04-21 LAB — MICROALBUMIN / CREATININE URINE RATIO
Creatinine, Urine: 69 mg/dL (ref 20–275)
Microalb Creat Ratio: 3 mg/g creat (ref <30)
Microalb, Ur: 0.2 mg/dL

## 2023-04-26 LAB — HM DIABETES EYE EXAM

## 2023-06-05 ENCOUNTER — Ambulatory Visit: Admission: RE | Admit: 2023-06-05 | Payer: BC Managed Care – PPO | Source: Ambulatory Visit

## 2023-06-05 ENCOUNTER — Encounter: Payer: Self-pay | Admitting: Internal Medicine

## 2023-06-05 DIAGNOSIS — Z1231 Encounter for screening mammogram for malignant neoplasm of breast: Secondary | ICD-10-CM | POA: Diagnosis not present

## 2023-06-08 MED ORDER — METFORMIN HCL 1000 MG PO TABS: 1000.0000 mg | ORAL_TABLET | Freq: Two times a day (BID) | ORAL | 0 refills | Status: DC

## 2023-06-26 LAB — HM DIABETES EYE EXAM

## 2023-08-02 ENCOUNTER — Other Ambulatory Visit: Payer: Self-pay | Admitting: Internal Medicine

## 2023-08-02 DIAGNOSIS — E1165 Type 2 diabetes mellitus with hyperglycemia: Secondary | ICD-10-CM

## 2023-08-03 NOTE — Telephone Encounter (Signed)
No longer current dosing on medication list Requested Prescriptions  Pending Prescriptions Disp Refills   metFORMIN (GLUCOPHAGE) 500 MG tablet [Pharmacy Med Name: METFORMIN 500MG  TABLETS] 180 tablet 0    Sig: TAKE 1 TABLET(500 MG) BY MOUTH TWICE DAILY WITH MEALS     Endocrinology:  Diabetes - Biguanides Failed - 08/02/2023  3:31 AM      Failed - B12 Level in normal range and within 720 days    No results found for: "VITAMINB12"       Failed - CBC within normal limits and completed in the last 12 months    WBC  Date Value Ref Range Status  04/20/2023 10.6 3.8 - 10.8 Thousand/uL Final   RBC  Date Value Ref Range Status  04/20/2023 5.21 (H) 3.80 - 5.10 Million/uL Final   Hemoglobin  Date Value Ref Range Status  04/20/2023 14.7 11.7 - 15.5 g/dL Final   HGB  Date Value Ref Range Status  06/02/2012 14.6 12.0 - 16.0 g/dL Final   HCT  Date Value Ref Range Status  04/20/2023 44.5 35.0 - 45.0 % Final  06/02/2012 42.6 35.0 - 47.0 % Final   MCHC  Date Value Ref Range Status  04/20/2023 33.0 32.0 - 36.0 g/dL Final   [facility]  Date Value Ref Range Status  04/20/2023 28.2 27.0 - 33.0 pg Final   MCV  Date Value Ref Range Status  04/20/2023 85.4 80.0 - 100.0 fL Final  06/02/2012 85 80 - 100 fL Final   No results found for: "PLTCOUNTKUC", "LABPLAT", "POCPLA" RDW  Date Value Ref Range Status  04/20/2023 12.9 11.0 - 15.0 % Final  06/02/2012 13.5 11.5 - 14.5 % Final         Passed - Cr in normal range and within 360 days    Creat  Date Value Ref Range Status  04/20/2023 0.76 0.50 - 1.03 mg/dL Final   Creatinine, Urine  Date Value Ref Range Status  04/20/2023 69 20 - 275 mg/dL Final         Passed - HBA1C is between 0 and 7.9 and within 180 days    Hgb A1c MFr Bld  Date Value Ref Range Status  04/20/2023 7.8 (H) <5.7 % of total Hgb Final    Comment:    For someone without known diabetes, a hemoglobin A1c value of 6.5% or greater indicates that they may have  diabetes and  this should be confirmed with a follow-up  test. . For someone with known diabetes, a value <7% indicates  that their diabetes is well controlled and a value  greater than or equal to 7% indicates suboptimal  control. A1c targets should be individualized based on  duration of diabetes, age, comorbid conditions, and  other considerations. . Currently, no consensus exists regarding use of hemoglobin A1c for diagnosis of diabetes for children. .          Passed - eGFR in normal range and within 360 days    GFR, Est African American  Date Value Ref Range Status  08/30/2020 103 > OR = 60 mL/min/1.31m2 Final   GFR, Est Non African American  Date Value Ref Range Status  08/30/2020 89 > OR = 60 mL/min/1.21m2 Final   eGFR  Date Value Ref Range Status  04/20/2023 94 > OR = 60 mL/min/1.18m2 Final         Passed - Valid encounter within last 6 months    Recent Outpatient Visits  3 months ago Encounter for general adult medical examination with abnormal findings   [place] [facility], [place] W, NP   8 months ago Type 2 diabetes mellitus with hyperglycemia, without long-term current use of insulin [facility])   [place] [facility] [place], Salvadore Oxford, NP   1 year ago Chronic pain of right knee   [place] [facility] Smitty Cords, DO   1 year ago Encounter for general adult medical examination with abnormal findings   [place] [facility] [place], Salvadore Oxford, NP   2 years ago Class 2 severe obesity due to excess calories with serious comorbidity and body mass index (BMI) of 36.0 to 36.9 in adult [facility])   [place] [facility] [place], Salvadore Oxford, NP       Future Appointments             In 2 months Baity, Salvadore Oxford, NP [place] [facility], [facility]

## 2023-09-14 ENCOUNTER — Other Ambulatory Visit: Payer: Self-pay | Admitting: Internal Medicine

## 2023-09-14 NOTE — Telephone Encounter (Signed)
Medication Refill -  Most Recent Primary Care Visit:  Provider: Lorre Munroe  Department: [facility]  Visit Type: PHYSICAL  Date: 04/20/2023  Medication:  metFORMIN (GLUCOPHAGE) 1000 MG tablet  *Patient is running low on medication  Has the patient contacted their pharmacy? Yes, advised to contact PCP  Is this the correct pharmacy for this prescription? YES If no, delete pharmacy and type the correct one.  This is the patient's preferred pharmacy:  [id number] - [place], [place] - [address] AT [facility] OF [address] [place] [address] [place] [address] Phone: [phone] Fax: [phone]   Has the prescription been filled recently? Last filled 8.5.2024  Is the patient out of the medication? Not completely but almost.  Has the patient been seen for an appointment in the last year OR does the patient have an upcoming appointment? Yes, follow up appt scheduled for 12.23.2024

## 2023-09-15 MED ORDER — METFORMIN HCL 1000 MG PO TABS: 1000.0000 mg | ORAL_TABLET | Freq: Two times a day (BID) | ORAL | 0 refills | Status: DC

## 2023-09-15 NOTE — Telephone Encounter (Signed)
Requested Prescriptions  Pending Prescriptions Disp Refills   metFORMIN (GLUCOPHAGE) 1000 MG tablet 180 tablet 0    Sig: Take 1 tablet (1,000 mg total) by mouth 2 (two) times daily with a meal.     Endocrinology:  Diabetes - Biguanides Failed - 09/14/2023  1:42 PM      Failed - B12 Level in normal range and within 720 days    No results found for: "VITAMINB12"       Failed - CBC within normal limits and completed in the last 12 months    WBC  Date Value Ref Range Status  04/20/2023 10.6 3.8 - 10.8 Thousand/uL Final   RBC  Date Value Ref Range Status  04/20/2023 5.21 (H) 3.80 - 5.10 Million/uL Final   Hemoglobin  Date Value Ref Range Status  04/20/2023 14.7 11.7 - 15.5 g/dL Final   HGB  Date Value Ref Range Status  06/02/2012 14.6 12.0 - 16.0 g/dL Final   HCT  Date Value Ref Range Status  04/20/2023 44.5 35.0 - 45.0 % Final  06/02/2012 42.6 35.0 - 47.0 % Final   MCHC  Date Value Ref Range Status  04/20/2023 33.0 32.0 - 36.0 g/dL Final   [facility]  Date Value Ref Range Status  04/20/2023 28.2 27.0 - 33.0 pg Final   MCV  Date Value Ref Range Status  04/20/2023 85.4 80.0 - 100.0 fL Final  06/02/2012 85 80 - 100 fL Final   No results found for: "PLTCOUNTKUC", "LABPLAT", "POCPLA" RDW  Date Value Ref Range Status  04/20/2023 12.9 11.0 - 15.0 % Final  06/02/2012 13.5 11.5 - 14.5 % Final         Passed - Cr in normal range and within 360 days    Creat  Date Value Ref Range Status  04/20/2023 0.76 0.50 - 1.03 mg/dL Final   Creatinine, Urine  Date Value Ref Range Status  04/20/2023 69 20 - 275 mg/dL Final         Passed - HBA1C is between 0 and 7.9 and within 180 days    Hgb A1c MFr Bld  Date Value Ref Range Status  04/20/2023 7.8 (H) <5.7 % of total Hgb Final    Comment:    For someone without known diabetes, a hemoglobin A1c value of 6.5% or greater indicates that they may have  diabetes and this should be confirmed with a follow-up  test. . For someone with  known diabetes, a value <7% indicates  that their diabetes is well controlled and a value  greater than or equal to 7% indicates suboptimal  control. A1c targets should be individualized based on  duration of diabetes, age, comorbid conditions, and  other considerations. . Currently, no consensus exists regarding use of hemoglobin A1c for diagnosis of diabetes for children. .          Passed - eGFR in normal range and within 360 days    GFR, Est African American  Date Value Ref Range Status  08/30/2020 103 > OR = 60 mL/min/1.2m2 Final   GFR, Est Non African American  Date Value Ref Range Status  08/30/2020 89 > OR = 60 mL/min/1.56m2 Final   eGFR  Date Value Ref Range Status  04/20/2023 94 > OR = 60 mL/min/1.53m2 Final         Passed - Valid encounter within last 6 months    Recent Outpatient Visits           4 months ago Encounter for general  adult medical examination with abnormal findings   [place] [facility] [place], [place] W, NP   9 months ago Type 2 diabetes mellitus with hyperglycemia, without long-term current use of insulin [facility])   [place] [facility] [place], Salvadore Oxford, NP   1 year ago Chronic pain of right knee   [place] [facility] Smitty Cords, DO   1 year ago Encounter for general adult medical examination with abnormal findings   [place] [facility] [place], Salvadore Oxford, NP   2 years ago Class 2 severe obesity due to excess calories with serious comorbidity and body mass index (BMI) of 36.0 to 36.9 in adult [facility])   [place] [facility] [place], Salvadore Oxford, NP       Future Appointments             In 1 month Hodge, Salvadore Oxford, NP [place] [facility], [facility]

## 2023-10-26 ENCOUNTER — Encounter: Payer: Self-pay | Admitting: Internal Medicine

## 2023-10-26 ENCOUNTER — Ambulatory Visit: Payer: BC Managed Care – PPO | Admitting: Internal Medicine

## 2023-10-26 VITALS — BP 140/78 | Ht 67.0 in | Wt 234.0 lb

## 2023-10-26 DIAGNOSIS — E66812 Obesity, class 2: Secondary | ICD-10-CM | POA: Diagnosis not present

## 2023-10-26 DIAGNOSIS — I10 Essential (primary) hypertension: Secondary | ICD-10-CM

## 2023-10-26 DIAGNOSIS — Z23 Encounter for immunization: Secondary | ICD-10-CM | POA: Diagnosis not present

## 2023-10-26 DIAGNOSIS — E1169 Type 2 diabetes mellitus with other specified complication: Secondary | ICD-10-CM

## 2023-10-26 DIAGNOSIS — E1165 Type 2 diabetes mellitus with hyperglycemia: Secondary | ICD-10-CM

## 2023-10-26 DIAGNOSIS — Z6836 Body mass index (BMI) 36.0-36.9, adult: Secondary | ICD-10-CM

## 2023-10-26 DIAGNOSIS — E785 Hyperlipidemia, unspecified: Secondary | ICD-10-CM

## 2023-10-26 LAB — POCT GLYCOSYLATED HEMOGLOBIN (HGB A1C): Hemoglobin A1C: 7.5 % — AB (ref 4.0–5.6)

## 2023-10-26 MED ORDER — AMLODIPINE-OLMESARTAN 5-20 MG PO TABS: 1.0000 | ORAL_TABLET | Freq: Every day | ORAL | 0 refills | Status: DC

## 2023-10-26 NOTE — Assessment & Plan Note (Signed)
POCT A1c 7.5% Urine microalbumin has been checked within the last year Continue metformin Encourage low-carb diet and exercise for weight loss Encourage routine eye exam Encourage routine foot exam flu shot today Pneumovax UTD Encouraged her to get her COVID booster

## 2023-10-26 NOTE — Assessment & Plan Note (Signed)
Encourage diet and exercise for weight loss 

## 2023-10-26 NOTE — Patient Instructions (Signed)

## 2023-10-26 NOTE — Progress Notes (Signed)
Subjective:    Patient ID: Elizabeth Dalton, female    DOB: December 07, 1970, 52 y.o.   MRN: [id number]  HPI  Patient presents to clinic today for follow-up of chronic conditions.  HTN: Her BP today is 148/84.  She is taking lisinopril as prescribed.  She reports history of whitecoat syndrome but does not monitor her blood pressure at home.  ECG from 05/2012 reviewed.  DM2: Her last A1c was 7.8%, 04/2023.  She is taking metformin as prescribed.  She does not check her sugars.  She checks her feet routinely.  Her last eye exam was 06/2023.  Flu 07/2021.  Pneumovax 02/2021.  COVID x 3.  HLD: Her last LDL was 95, triglycerides 409, 04/2023.  She denies myalgias on rosuvastatin.  She tries to consume low-fat diet.  Chronic cough: She has tried antihistamines, omeprazole with minimal relief of symptoms.  We discussed changing her lisinopril to amlodipine if the cough persisted.  Review of Systems  Past Medical History:  Diagnosis Date   Allergy     Current Outpatient Medications  Medication Sig Dispense Refill   blood glucose meter kit and supplies KIT Dispense based on patient and insurance preference. Use up to four times daily as directed. (FOR ICD-9 250.00, 250.01). 1 each 0   levocetirizine (XYZAL) 5 MG tablet Take 5 mg by mouth every evening.     lisinopril (ZESTRIL) 10 MG tablet TAKE 1 TABLET(10 MG) BY MOUTH DAILY 90 tablet 1   metFORMIN (GLUCOPHAGE) 1000 MG tablet Take 1 tablet (1,000 mg total) by mouth 2 (two) times daily with a meal. 180 tablet 0   MULTIPLE VITAMINS PO Take by mouth.     rosuvastatin (CRESTOR) 10 MG tablet TAKE 1 TABLET(10 MG) BY MOUTH DAILY 90 tablet 1   No current facility-administered medications for this visit.    Allergies  Allergen Reactions   Latex Rash    Family History  Problem Relation Age of Onset   Lung cancer Mother    Heart disease Mother    Hypertension Mother    Diabetes Mother        T1DM   Heart disease Father    Hypertension  Father    Diabetes Sister    Diabetes Sister    Dementia Maternal Grandmother    Liver disease Paternal Grandfather    Alcohol abuse Paternal Grandfather    Breast cancer Neg Hx     Social History   Socioeconomic History   Marital status: Married    Spouse name: Not on file   Number of children: Not on file   Years of education: Not on file   Highest education level: Bachelor's degree (e.g., BA, AB, BS)  Occupational History   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  Tobacco Use   Smoking status: Never   Smokeless tobacco: Never  Vaping Use   Vaping status: Never Used  Substance and Sexual Activity   Alcohol use: Yes    Alcohol/week: 1.0 standard drink of alcohol    Types: 1 Standard drinks or equivalent per week    Comment: occasionally    Drug use: Never   Sexual activity: Yes  Other Topics Concern   Not on file  Social History Narrative   Not on file   Social Drivers of Health   Financial Resource Strain: Low Risk  (10/25/2023)   Overall Financial Resource Strain (CARDIA)    Difficulty of Paying Living  Expenses: Not hard at all  Food Insecurity: No Food Insecurity (10/25/2023)   Hunger Vital Sign    Worried About Running Out of Food in the Last Year: Never true    Ran Out of Food in the Last Year: Never true  Transportation Needs: No Transportation Needs (10/25/2023)   PRAPARE - Administrator, Civil Service (Medical): No    Lack of Transportation (Non-Medical): No  Physical Activity: Insufficiently Active (10/25/2023)   Exercise Vital Sign    Days of Exercise per Week: 2 days    Minutes of Exercise per Session: 30 min  Stress: Stress Concern Present (10/25/2023)   [employer] of Occupational Health - Occupational Stress Questionnaire    Feeling of Stress : To some extent  Social Connections: Moderately Isolated (10/25/2023)   Social Connection and Isolation Panel [NHANES]     Frequency of Communication with Friends and Family: Once a week    Frequency of Social Gatherings with Friends and Family: Once a week    Attends Religious Services: More than 4 times per year    Active Member of [employer] or Organizations: No    Attends Engineer, structural: Not on file    Marital Status: Married  Catering manager Violence: Not At Risk (01/21/2019)   Humiliation, Afraid, Rape, and Kick questionnaire    Fear of Current or Ex-Partner: No    Emotionally Abused: No    Physically Abused: No    Sexually Abused: No     Constitutional: Denies fever, malaise, fatigue, headache or abrupt weight changes.  HEENT:  Denies eye pain, eye redness, ear pain, ringing in the ears, wax buildup, runny nose, nasal congestion, bloody nose, or sore throat. Respiratory: Patient reports chronic cough.  Denies difficulty breathing, shortness of breath, or sputum production.   Cardiovascular: Denies chest pain, chest tightness, palpitations or swelling in the hands or feet.  Gastrointestinal: Denies abdominal pain, bloating, constipation, diarrhea or blood in the stool.  GU: Denies urgency, frequency, pain with urination, burning sensation, blood in urine, odor or discharge. Musculoskeletal: Denies decrease in range of motion, difficulty with gait, muscle pain or joint pain and swelling.  Skin: Denies redness, rashes, lesions or ulcercations.  Neurological: Denies dizziness, difficulty with memory, difficulty with speech or problems with balance and coordination.  Psych: Denies anxiety, depression, SI/HI.  No other specific complaints in a complete review of systems (except as listed in HPI above).     Objective:   Physical Exam  BP (!) 140/78   Ht 5\' 7"  (1.702 m)   Wt 234 lb (106.1 kg)   BMI 36.65 kg/m    Wt Readings from Last 3 Encounters:  04/20/23 236 lb (107 kg)  12/01/22 235 lb (106.6 kg)  05/28/22 240 lb (108.9 kg)    General: Appears her stated age, obese, in  NAD. Skin: Warm, dry and intact. No ulcerations noted. Cardiovascular: Normal rate and rhythm. S1,S2 noted.  No murmur, rubs or gallops noted. No JVD or BLE edema. No carotid bruits noted. Pulmonary/Chest: Normal effort and positive vesicular breath sounds. No respiratory distress. No wheezes, rales or ronchi noted.  Musculoskeletal: . No difficulty with gait.  Neurological: Alert and oriented.  Coordination normal.      BMET    Component Value Date/Time   NA 138 04/20/2023 1345   NA 139 06/02/2012 1926   K 4.6 04/20/2023 1345   K 3.9 06/02/2012 1926   CL 101 04/20/2023 1345   CL 103 06/02/2012 1926  CO2 29 04/20/2023 1345   CO2 26 06/02/2012 1926   GLUCOSE 153 (H) 04/20/2023 1345   GLUCOSE 129 (H) 06/02/2012 1926   BUN 14 04/20/2023 1345   BUN 10 06/02/2012 1926   CREATININE 0.76 04/20/2023 1345   CALCIUM 10.0 04/20/2023 1345   CALCIUM 8.7 06/02/2012 1926   GFRNONAA 89 08/30/2020 1023   GFRAA 103 08/30/2020 1023    Lipid Panel     Component Value Date/Time   CHOL 186 04/20/2023 1345   TRIG 239 (H) 04/20/2023 1345   HDL 55 04/20/2023 1345   CHOLHDL 3.4 04/20/2023 1345   LDLCALC 95 04/20/2023 1345    CBC    Component Value Date/Time   WBC 10.6 04/20/2023 1345   RBC 5.21 (H) 04/20/2023 1345   HGB 14.7 04/20/2023 1345   HGB 14.6 06/02/2012 1926   HCT 44.5 04/20/2023 1345   HCT 42.6 06/02/2012 1926   PLT 225 04/20/2023 1345   PLT 250 06/02/2012 1926   MCV 85.4 04/20/2023 1345   MCV 85 06/02/2012 1926   MCH 28.2 04/20/2023 1345   MCHC 33.0 04/20/2023 1345   RDW 12.9 04/20/2023 1345   RDW 13.5 06/02/2012 1926   LYMPHSABS 2,936 08/30/2020 1023   LYMPHSABS 3.3 05/17/2012 0630   MONOABS 0.6 05/17/2012 0630   EOSABS 98 08/30/2020 1023   EOSABS 0.2 05/17/2012 0630   BASOSABS 41 08/30/2020 1023   BASOSABS 0.0 05/17/2012 0630    Hgb A1C Lab Results  Component Value Date   HGBA1C 7.8 (H) 04/20/2023           Assessment & Plan:    RTC in 2 weeks,  follow-up HTN 6 months for annual exam Nicki Reaper, NP

## 2023-10-26 NOTE — Assessment & Plan Note (Signed)
Given cough, will discontinue lisinopril and change to amlodipine-olmesartan 5-20 mg daily Reinforced DASH diet and exercise for weight loss C-Met today

## 2023-10-26 NOTE — Assessment & Plan Note (Signed)
C-Met and lipid profile today Encouraged her to consume low-fat diet Continue rosuvastatin 

## 2023-10-27 LAB — CBC
HCT: 44.9 % (ref 35.0–45.0)
Hemoglobin: 14.7 g/dL (ref 11.7–15.5)
MCH: 28 pg (ref 27.0–33.0)
MCHC: 32.7 g/dL (ref 32.0–36.0)
MCV: 85.5 fL (ref 80.0–100.0)
MPV: 10.2 fL (ref 7.5–12.5)
Platelets: 231 10*3/uL (ref 140–400)
RBC: 5.25 10*6/uL — ABNORMAL HIGH (ref 3.80–5.10)
RDW: 12.3 % (ref 11.0–15.0)
WBC: 8.4 10*3/uL (ref 3.8–10.8)

## 2023-10-27 LAB — COMPLETE METABOLIC PANEL WITH GFR
AG Ratio: 1.4 (calc) (ref 1.0–2.5)
ALT: 53 U/L — ABNORMAL HIGH (ref 6–29)
AST: 34 U/L (ref 10–35)
Albumin: 4.1 g/dL (ref 3.6–5.1)
Alkaline phosphatase (APISO): 75 U/L (ref 37–153)
BUN: 10 mg/dL (ref 7–25)
CO2: 28 mmol/L (ref 20–32)
Calcium: 9.5 mg/dL (ref 8.6–10.4)
Chloride: 102 mmol/L (ref 98–110)
Creat: 0.69 mg/dL (ref 0.50–1.03)
Globulin: 3 g/dL (ref 1.9–3.7)
Glucose, Bld: 160 mg/dL — ABNORMAL HIGH (ref 65–99)
Potassium: 4.5 mmol/L (ref 3.5–5.3)
Sodium: 138 mmol/L (ref 135–146)
Total Bilirubin: 0.6 mg/dL (ref 0.2–1.2)
Total Protein: 7.1 g/dL (ref 6.1–8.1)
eGFR: 104 mL/min/{1.73_m2} (ref >=60)

## 2023-10-27 LAB — LIPID PANEL
Cholesterol: 145 mg/dL (ref <200)
HDL: 63 mg/dL (ref >=50)
LDL Cholesterol (Calc): 68 mg/dL
Non-HDL Cholesterol (Calc): 82 mg/dL (ref <130)
Total CHOL/HDL Ratio: 2.3 (calc) (ref <5.0)
Triglycerides: 65 mg/dL (ref <150)

## 2023-11-10 ENCOUNTER — Other Ambulatory Visit: Payer: Self-pay | Admitting: Internal Medicine

## 2023-11-10 DIAGNOSIS — E1165 Type 2 diabetes mellitus with hyperglycemia: Secondary | ICD-10-CM

## 2023-11-10 DIAGNOSIS — Z0001 Encounter for general adult medical examination with abnormal findings: Secondary | ICD-10-CM

## 2023-11-12 NOTE — Telephone Encounter (Signed)
 Requested Prescriptions  Refused Prescriptions Disp Refills   lisinopril  (ZESTRIL ) 10 MG tablet [Pharmacy Med Name: LISINOPRIL  10MG  TABLETS] 90 tablet 1    Sig: TAKE 1 TABLET(10 MG) BY MOUTH DAILY     Cardiovascular:  ACE Inhibitors Failed - 11/12/2023 12:57 PM      Failed - Last BP in normal range    BP Readings from Last 1 Encounters:  10/26/23 (!) 140/78         Passed - Cr in normal range and within 180 days    Creat  Date Value Ref Range Status  10/26/2023 0.69 0.50 - 1.03 mg/dL Final   Creatinine, Urine  Date Value Ref Range Status  04/20/2023 69 20 - 275 mg/dL Final         Passed - K in normal range and within 180 days    Potassium  Date Value Ref Range Status  10/26/2023 4.5 3.5 - 5.3 mmol/L Final  06/02/2012 3.9 3.5 - 5.1 mmol/L Final         Passed - Patient is not pregnant      Passed - Valid encounter within last 6 months    Recent Outpatient Visits           2 weeks ago Type 2 diabetes mellitus with hyperglycemia, without long-term current use of insulin [facility])   [place] [facility] [place], Angeline ORN, NP   6 months ago Encounter for general adult medical examination with abnormal findings   [place] [facility] [place], [place] W, NP   11 months ago Type 2 diabetes mellitus with hyperglycemia, without long-term current use of insulin [facility])   [place] [facility] [place], Angeline ORN, NP   1 year ago Chronic pain of right knee   [place] [facility] [place] PARAS, DO   1 year ago Encounter for general adult medical examination with abnormal findings   [place] [facility] [place], Angeline ORN, NP       Future Appointments             In 1 week Baity, Angeline ORN, NP [facility]   In 5 months [place], Angeline ORN, NP [facility], [facility]

## 2023-11-23 ENCOUNTER — Ambulatory Visit: Payer: Self-pay | Admitting: Internal Medicine

## 2023-11-23 ENCOUNTER — Encounter: Payer: Self-pay | Admitting: Internal Medicine

## 2023-11-23 VITALS — BP 138/78 | Ht 67.0 in | Wt 235.0 lb

## 2023-11-23 DIAGNOSIS — E66812 Obesity, class 2: Secondary | ICD-10-CM

## 2023-11-23 DIAGNOSIS — I10 Essential (primary) hypertension: Secondary | ICD-10-CM

## 2023-11-23 DIAGNOSIS — Z6836 Body mass index (BMI) 36.0-36.9, adult: Secondary | ICD-10-CM

## 2023-11-23 DIAGNOSIS — L723 Sebaceous cyst: Secondary | ICD-10-CM

## 2023-11-23 MED ORDER — METFORMIN HCL 1000 MG PO TABS: 1000.0000 mg | ORAL_TABLET | Freq: Two times a day (BID) | ORAL | 0 refills | Status: DC

## 2023-11-23 MED ORDER — AMLODIPINE-OLMESARTAN 10-40 MG PO TABS: 1.0000 | ORAL_TABLET | Freq: Every day | ORAL | 1 refills | Status: DC

## 2023-11-23 NOTE — Progress Notes (Signed)
Subjective:    Patient ID: Elizabeth Dalton, female    DOB: [birth year], 53 y.o.   MRN: [id number]  HPI  Patient presents to clinic today for 2-week follow-up of HTN.  At her last visit her lisinopril was discontinued secondary to a cough and changed to amlodipine-olmesartan.  She has been taking the medication as prescribed.  Her BP today is 142/86.  ECG from 05/2012 reviewed.   She also reports a cyst on the right side of her neck.  She has tried squeezing this and nothing has come out.  She would like referral to dermatology for further evaluation.  Review of Systems  Past Medical History:  Diagnosis Date   Allergy     Current Outpatient Medications  Medication Sig Dispense Refill   amLODipine-olmesartan (AZOR) 5-20 MG tablet Take 1 tablet by mouth daily. 30 tablet 0   blood glucose meter kit and supplies KIT Dispense based on patient and insurance preference. Use up to four times daily as directed. (FOR ICD-9 250.00, 250.01). 1 each 0   levocetirizine (XYZAL) 5 MG tablet Take 5 mg by mouth every evening.     metFORMIN (GLUCOPHAGE) 1000 MG tablet Take 1 tablet (1,000 mg total) by mouth 2 (two) times daily with a meal. (Patient taking differently: Take 1,000 mg by mouth once.) 180 tablet 0   MULTIPLE VITAMINS PO Take by mouth.     rosuvastatin (CRESTOR) 10 MG tablet TAKE 1 TABLET(10 MG) BY MOUTH DAILY 90 tablet 1   No current facility-administered medications for this visit.    Allergies  Allergen Reactions   Latex Rash    Family History  Problem Relation Age of Onset   Lung cancer Mother    Heart disease Mother    Hypertension Mother    Diabetes Mother        T1DM   Heart disease Father    Hypertension Father    Diabetes Sister    Diabetes Sister    Dementia Maternal Grandmother    Liver disease Paternal Grandfather    Alcohol abuse Paternal Grandfather    Breast cancer Neg Hx     Social History   Socioeconomic History   Marital status: Married     Spouse name: Not on file   Number of children: Not on file   Years of education: Not on file   Highest education level: Bachelor's degree (e.g., BA, AB, BS)  Occupational History   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  Tobacco Use   Smoking status: Never   Smokeless tobacco: Never  Vaping Use   Vaping status: Never Used  Substance and Sexual Activity   Alcohol use: Yes    Alcohol/week: 1.0 standard drink of alcohol    Types: 1 Standard drinks or equivalent per week    Comment: occasionally    Drug use: Never   Sexual activity: Yes  Other Topics Concern   Not on file  Social History Narrative   Not on file   Social Drivers of Health   Financial Resource Strain: Low Risk  (10/25/2023)   Overall Financial Resource Strain (CARDIA)    Difficulty of Paying Living Expenses: Not hard at all  Food Insecurity: No Food Insecurity (10/25/2023)   Hunger Vital Sign    Worried About Running Out of Food in the Last Year: Never true    Ran Out of Food in the Last Year: Never true  Transportation Needs:  No Transportation Needs (10/25/2023)   PRAPARE - Administrator, Civil Service (Medical): No    Lack of Transportation (Non-Medical): No  Physical Activity: Insufficiently Active (10/25/2023)   Exercise Vital Sign    Days of Exercise per Week: 2 days    Minutes of Exercise per Session: 30 min  Stress: Stress Concern Present (10/25/2023)   [employer] of Occupational Health - Occupational Stress Questionnaire    Feeling of Stress : To some extent  Social Connections: Moderately Isolated (10/25/2023)   Social Connection and Isolation Panel [NHANES]    Frequency of Communication with Friends and Family: Once a week    Frequency of Social Gatherings with Friends and Family: Once a week    Attends Religious Services: More than 4 times per year    Active Member of [employer] or Organizations: No    Attends Museum/gallery exhibitions officer: Not on file    Marital Status: Married  Catering manager Violence: Not At Risk (01/21/2019)   Humiliation, Afraid, Rape, and Kick questionnaire    Fear of Current or Ex-Partner: No    Emotionally Abused: No    Physically Abused: No    Sexually Abused: No     Constitutional: Denies fever, malaise, fatigue, headache or abrupt weight changes.  HEENT:  Denies eye pain, eye redness, ear pain, ringing in the ears, wax buildup, runny nose, nasal congestion, bloody nose, or sore throat. Respiratory: Patient reports chronic cough.  Denies difficulty breathing, shortness of breath, or sputum production.   Cardiovascular: Denies chest pain, chest tightness, palpitations or swelling in the hands or feet.  Gastrointestinal: Denies abdominal pain, bloating, constipation, diarrhea or blood in the stool.  GU: Denies urgency, frequency, pain with urination, burning sensation, blood in urine, odor or discharge. Musculoskeletal: Denies decrease in range of motion, difficulty with gait, muscle pain or joint pain and swelling.  Skin: Pt reports cyst on right side of neck. Denies redness, rashes, or ulcercations.  Neurological: Denies dizziness, difficulty with memory, difficulty with speech or problems with balance and coordination.  Psych: Denies anxiety, depression, SI/HI.  No other specific complaints in a complete review of systems (except as listed in HPI above).     Objective:   Physical Exam  BP 138/78   Ht 5\' 7"  (1.702 m)   Wt 235 lb (106.6 kg)   BMI 36.81 kg/m     Wt Readings from Last 3 Encounters:  10/26/23 234 lb (106.1 kg)  04/20/23 236 lb (107 kg)  12/01/22 235 lb (106.6 kg)    General: Appears her stated age, obese, in NAD. Skin: Warm, dry and intact. 3-4 mm sebacous cyst noted of right side of neck Cardiovascular: Normal rate and rhythm. S1,S2 noted.  No murmur, rubs or gallops noted.  Pulmonary/Chest: Normal effort and positive vesicular breath sounds.  No respiratory distress. No wheezes, rales or ronchi noted.  Neurological: Alert and oriented.      BMET    Component Value Date/Time   NA 138 10/26/2023 0832   NA 139 06/02/2012 1926   K 4.5 10/26/2023 0832   K 3.9 06/02/2012 1926   CL 102 10/26/2023 0832   CL 103 06/02/2012 1926   CO2 28 10/26/2023 0832   CO2 26 06/02/2012 1926   GLUCOSE 160 (H) 10/26/2023 0832   GLUCOSE 129 (H) 06/02/2012 1926   BUN 10 10/26/2023 0832   BUN 10 06/02/2012 1926   CREATININE 0.69 10/26/2023 0832   CALCIUM 9.5 10/26/2023 2440  CALCIUM 8.7 06/02/2012 1926   GFRNONAA 89 08/30/2020 1023   GFRAA 103 08/30/2020 1023    Lipid Panel     Component Value Date/Time   CHOL 145 10/26/2023 0832   TRIG 65 10/26/2023 0832   HDL 63 10/26/2023 0832   CHOLHDL 2.3 10/26/2023 0832   LDLCALC 68 10/26/2023 0832    CBC    Component Value Date/Time   WBC 8.4 10/26/2023 0832   RBC 5.25 (H) 10/26/2023 0832   HGB 14.7 10/26/2023 0832   HGB 14.6 06/02/2012 1926   HCT 44.9 10/26/2023 0832   HCT 42.6 06/02/2012 1926   PLT 231 10/26/2023 0832   PLT 250 06/02/2012 1926   MCV 85.5 10/26/2023 0832   MCV 85 06/02/2012 1926   MCH 28.0 10/26/2023 0832   MCHC 32.7 10/26/2023 0832   RDW 12.3 10/26/2023 0832   RDW 13.5 06/02/2012 1926   LYMPHSABS 2,936 08/30/2020 1023   LYMPHSABS 3.3 05/17/2012 0630   MONOABS 0.6 05/17/2012 0630   EOSABS 98 08/30/2020 1023   EOSABS 0.2 05/17/2012 0630   BASOSABS 41 08/30/2020 1023   BASOSABS 0.0 05/17/2012 0630    Hgb A1C Lab Results  Component Value Date   HGBA1C 7.5 (A) 10/26/2023           Assessment & Plan:    RTC in 6 months for annual exam Nicki Reaper, NP

## 2023-11-23 NOTE — Assessment & Plan Note (Signed)
Cough has resolved with discontinuation of lisinopril BP remains elevated on amlodipine-olmesartan 5-20, will increase to 10-40 mg daily Reinforced DASH diet and exercise for weight

## 2023-11-23 NOTE — Assessment & Plan Note (Signed)
Encourage diet and exercise for weight loss 

## 2023-11-23 NOTE — Patient Instructions (Signed)
Pocket of Fluid in the Skin (Epidermoid Cyst): What to Know  An epidermoid cyst is a small lump under your skin. The cyst contains a substance that is thick and oily. What are the causes? A blocked hair follicle. A hair curls and re-enters the skin instead of growing straight out of the skin. A blocked pore. Irritated skin. An injury to the skin. Certain conditions that are passed from parent to child. Human papillomavirus (HPV). This happens rarely when cysts occur on the bottom of the feet. Long-term sun damage to the skin. What increases the risk? Having acne. Being female. Having an injury to the skin. Being past puberty. Certain conditions that are passed down through family (genetic disorder). What are the signs or symptoms? The only sign of this type of cyst may be a small, painless lump under the skin. These cysts are usually painless, but they can get infected. Symptoms of infection may include: Redness. Inflammation. Tenderness. Warmth. Fever. A bad-smelling substance that drains from the cyst. Pus that drains from the cyst. How is this treated? If a cyst becomes inflamed, treatment may include: Opening and draining the cyst. Antibiotics. Shots of medicines called steroids that help lessen inflammation. Surgery to remove the cyst if it's large, painful, or could turn into cancer. Do not try to open or squeeze a cyst yourself. Follow these instructions at home: Medicines Take your medicines only as told. If you were given antibiotics, take them as told. Do not stop taking them even if you start to feel better. General instructions Keep the area around your cyst clean and dry. Wear loose, dry clothing. Avoid touching your cyst. Check your cyst every day for signs of infection. Check for: Redness, swelling, or pain. Fluid or blood. Warmth. Pus or a bad smell. Keep all follow-up visits to make sure there's no discomfort or infection. Contact a doctor if: You have  any signs of infection. Your cyst doesn't get better or gets worse. You get a cyst that looks different from other cysts you've had. You have a fever. You have redness that spreads from the cyst. This information is not intended to replace advice given to you by your health care provider. Make sure you discuss any questions you have with your health care provider. Document Revised: 06/05/2023 Document Reviewed: 06/05/2023 Elsevier Patient Education  2024 [employer].

## 2023-12-03 ENCOUNTER — Other Ambulatory Visit: Payer: Self-pay | Admitting: Internal Medicine

## 2023-12-04 NOTE — Telephone Encounter (Signed)
Unable to refill per protocol, Rx expired. Discontinued 11/23/23, dose change.  Requested Prescriptions  Pending Prescriptions Disp Refills   amLODipine-olmesartan (AZOR) 5-20 MG tablet [Pharmacy Med Name: AMLODIPINE/OLMES MEDOXOM 5-20MG  T] 30 tablet 0    Sig: TAKE 1 TABLET BY MOUTH DAILY     Cardiovascular: CCB + ARB Combos Passed - 12/04/2023  9:26 AM      Passed - K in normal range and within 180 days    Potassium  Date Value Ref Range Status  10/26/2023 4.5 3.5 - 5.3 mmol/L Final  06/02/2012 3.9 3.5 - 5.1 mmol/L Final         Passed - Cr in normal range and within 180 days    Creat  Date Value Ref Range Status  10/26/2023 0.69 0.50 - 1.03 mg/dL Final   Creatinine, Urine  Date Value Ref Range Status  04/20/2023 69 20 - 275 mg/dL Final         Passed - Na in normal range and within 180 days    Sodium  Date Value Ref Range Status  10/26/2023 138 135 - 146 mmol/L Final  06/02/2012 139 136 - 145 mmol/L Final         Passed - Patient is not pregnant      Passed - Last BP in normal range    BP Readings from Last 1 Encounters:  11/23/23 138/78         Passed - Valid encounter within last 6 months    Recent Outpatient Visits           1 week ago Primary hypertension   [place] [facility] [place], Salvadore [place], NP   1 month ago Type 2 diabetes mellitus with hyperglycemia, without long-term current use of insulin [facility])   [place] [facility] [place], Salvadore Oxford, NP   7 months ago Encounter for general adult medical examination with abnormal findings   [place] [facility] [place], [place] W, NP   1 year ago Type 2 diabetes mellitus with hyperglycemia, without long-term current use of insulin [facility])   [place] [facility] [place], Salvadore Oxford, NP   1 year ago Chronic pain of right knee   [place] [facility] S[place]ty Cords, DO       Future Appointments             In 4 months  [place], Salvadore [place], NP [place] [facility]

## 2024-01-18 ENCOUNTER — Encounter: Payer: Self-pay | Admitting: Internal Medicine

## 2024-01-20 ENCOUNTER — Ambulatory Visit: Payer: Self-pay | Admitting: Internal Medicine

## 2024-01-20 NOTE — Telephone Encounter (Signed)
  Chief Complaint: Ankle swelling Symptoms: mild swelling to bilateral ankles Frequency: initially started a month ago but patient states the last 2-3 weeks has become more noticeable Pertinent Negatives: Patient denies fever, CP, SOB Disposition: [] ED /[] Urgent Care (no appt availability in office) / [x] Appointment(In office/virtual)/ []  [facility]/ [] Home Care/ [] Refused Recommended Disposition /[] [facility]/ []  Follow-up with PCP Additional Notes: patient called with concerns for bilateral ankle swelling. 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 Patient endorses swelling has increased over the last 2-3 weeks. Patient states swelling is mild and hasn't increased up her leg. Mild Pain.  Per protocol, patient is recommended to be seen within 3 days. Appointment made for 01/22/2024 at 4:00 pm. Patient verbalized understanding of plan and all questions answered.    Copied from CRM [id number]. Topic: Clinical - Red Word Triage >> Jan 20, 2024  9:36 AM Emylou G wrote: [facility] that prompted transfer to Nurse Triage: swelling ankles Reason for Disposition  MILD or MODERATE ankle swelling (e.g., can't move joint normally, can't do usual activities) (Exceptions: Itchy, localized swelling; swelling is chronic.)  Answer Assessment - Initial Assessment Questions 1. LOCATION: "Which ankle is swollen?" "Where is the swelling?"     Both ankles-involves the entire ankle 2. ONSET: "When did the swelling start?"     A month ago-increased over 2 weeks ago 3. SWELLING: "How bad is the swelling?" Or, "How large is it?" (e.g., mild, moderate, severe; size of localized swelling)    - NONE: No joint swelling.   - LOCALIZED: Localized; small area of puffy or swollen skin (e.g., insect bite, skin irritation).   - MILD: Joint looks or feels mildly swollen or puffy.   - MODERATE: Swollen; interferes with normal activities (e.g.,  work or school); decreased range of movement; may be limping.   - SEVERE: Very swollen; can't move swollen joint at all; limping a lot or unable to walk.     Mild 4. PAIN: "Is there any pain?" If Yes, ask: "How bad is it?" (Scale 1-10; or mild, moderate, severe)   - NONE (0): no pain.   - MILD (1-3): doesn't interfere with normal activities.    - MODERATE (4-7): interferes with normal activities (e.g., work or school) or awakens from sleep, limping.    - SEVERE (8-10): excruciating pain, unable to do any normal activities, unable to walk.      Mild 5. CAUSE: "What do you think caused the ankle swelling?"     unsure 6. OTHER SYMPTOMS: "Do you have any other symptoms?" (e.g., fever, chest pain, difficulty breathing, calf pain)     NO  Protocols used: Ankle Swelling-A-AH

## 2024-01-20 NOTE — Telephone Encounter (Signed)
 Will discuss at upcoming appointment

## 2024-01-22 ENCOUNTER — Encounter: Payer: Self-pay | Admitting: Internal Medicine

## 2024-01-22 ENCOUNTER — Ambulatory Visit: Payer: Self-pay | Admitting: Internal Medicine

## 2024-01-22 VITALS — BP 124/78 | Ht 67.0 in | Wt 235.0 lb

## 2024-01-22 DIAGNOSIS — I10 Essential (primary) hypertension: Secondary | ICD-10-CM

## 2024-01-22 DIAGNOSIS — E66812 Obesity, class 2: Secondary | ICD-10-CM

## 2024-01-22 DIAGNOSIS — R600 Localized edema: Secondary | ICD-10-CM

## 2024-01-22 DIAGNOSIS — Z6836 Body mass index (BMI) 36.0-36.9, adult: Secondary | ICD-10-CM

## 2024-01-22 MED ORDER — OLMESARTAN MEDOXOMIL 40 MG PO TABS: 40.0000 mg | ORAL_TABLET | Freq: Every day | ORAL | 0 refills | Status: DC

## 2024-01-22 MED ORDER — HYDRALAZINE HCL 25 MG PO TABS
25.0000 mg | ORAL_TABLET | Freq: Two times a day (BID) | ORAL | 0 refills | Status: DC
Start: 2024-01-22 — End: 2024-05-02

## 2024-01-22 NOTE — Progress Notes (Signed)
 Subjective:    Patient ID: Elizabeth Dalton, female    DOB: 06-20-1971, 53 y.o.   MRN: [id number]  HPI  Discussed the use of AI scribe software for clinical note transcription with the patient, who gave verbal consent to proceed.   Elizabeth Dalton is a 53 year old female with hypertension who presents with ankle swelling.  She has been experiencing swelling in her ankles for several weeks, with the swelling being more pronounced on one side. The swelling causes discomfort and affects her ability to wear certain shoes, such as sandals, due to tightness.  She is currently taking a combination of amlodipine and olmesartan for hypertension. She suspects that amlodipine is contributing to the swelling. Wearing compression socks to bed prevents swelling overnight, but without them, her ankles remain swollen in the morning. The swelling typically progresses throughout the day.     Review of Systems  Past Medical History:  Diagnosis Date   Allergy     Current Outpatient Medications  Medication Sig Dispense Refill   amLODipine-olmesartan (AZOR) 10-40 MG tablet Take 1 tablet by mouth daily. 90 tablet 1   blood glucose meter kit and supplies KIT Dispense based on patient and insurance preference. Use up to four times daily as directed. (FOR ICD-9 250.00, 250.01). 1 each 0   levocetirizine (XYZAL) 5 MG tablet Take 5 mg by mouth every evening.     metFORMIN (GLUCOPHAGE) 1000 MG tablet Take 1 tablet (1,000 mg total) by mouth 2 (two) times daily with a meal. 180 tablet 0   MULTIPLE VITAMINS PO Take by mouth.     rosuvastatin (CRESTOR) 10 MG tablet TAKE 1 TABLET(10 MG) BY MOUTH DAILY 90 tablet 1   No current facility-administered medications for this visit.    Allergies  Allergen Reactions   Latex Rash    Family History  Problem Relation Age of Onset   Lung cancer Mother    Heart disease Mother    Hypertension Mother    Diabetes Mother        T1DM   Heart  disease Father    Hypertension Father    Diabetes Sister    Diabetes Sister    Dementia Maternal Grandmother    Liver disease Paternal Grandfather    Alcohol abuse Paternal Grandfather    Breast cancer Neg Hx     Social History   Socioeconomic History   Marital status: Married    Spouse name: Not on file   Number of children: Not on file   Years of education: Not on file   Highest education level: Bachelor's degree (e.g., BA, AB, BS)  Occupational History   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  Tobacco Use   Smoking status: Never   Smokeless tobacco: Never  Vaping Use   Vaping status: Never Used  Substance and Sexual Activity   Alcohol use: Yes    Alcohol/week: 1.0 standard drink of alcohol    Types: 1 Standard drinks or equivalent per week    Comment: occasionally    Drug use: Never   Sexual activity: Yes  Other Topics Concern   Not on file  Social History Narrative   Not on file   Social Drivers of Health   Financial Resource Strain: Low Risk  (10/25/2023)   Overall Financial Resource Strain (CARDIA)    Difficulty of Paying Living Expenses: Not hard at all  Food Insecurity: No Food Insecurity (  10/25/2023)   Hunger Vital Sign    Worried About Running Out of Food in the Last Year: Never true    Ran Out of Food in the Last Year: Never true  Transportation Needs: No Transportation Needs (10/25/2023)   PRAPARE - Administrator, Civil Service (Medical): No    Lack of Transportation (Non-Medical): No  Physical Activity: Insufficiently Active (10/25/2023)   Exercise Vital Sign    Days of Exercise per Week: 2 days    Minutes of Exercise per Session: 30 min  Stress: Stress Concern Present (10/25/2023)   [employer] of Occupational Health - Occupational Stress Questionnaire    Feeling of Stress : To some extent  Social Connections: Moderately Isolated (10/25/2023)   Social Connection and  Isolation Panel [NHANES]    Frequency of Communication with Friends and Family: Once a week    Frequency of Social Gatherings with Friends and Family: Once a week    Attends Religious Services: More than 4 times per year    Active Member of [employer] or Organizations: No    Attends Engineer, structural: Not on file    Marital Status: Married  Catering manager Violence: Not At Risk (01/21/2019)   Humiliation, Afraid, Rape, and Kick questionnaire    Fear of Current or Ex-Partner: No    Emotionally Abused: No    Physically Abused: No    Sexually Abused: No     Constitutional: Denies fever, malaise, fatigue, headache or abrupt weight changes.  Respiratory: Patient reports chronic cough.  Denies difficulty breathing, shortness of breath, or sputum production.   Cardiovascular: Patient reports swelling in feet.  Denies chest pain, chest tightness, palpitations or swelling in the hands.  Musculoskeletal: Denies decrease in range of motion, difficulty with gait, muscle pain or joint pain and swelling.  Skin: Denies redness, rashes, or ulcercations.  Neurological: Denies dizziness, difficulty with memory, difficulty with speech or problems with balance and coordination.   No other specific complaints in a complete review of systems (except as listed in HPI above).     Objective:   Physical Exam  There were no vitals taken for this visit.    Wt Readings from Last 3 Encounters:  11/23/23 235 lb (106.6 kg)  10/26/23 234 lb (106.1 kg)  04/20/23 236 lb (107 kg)    General: Appears her stated age, obese, in NAD. Skin: Warm, dry and intact.  Cardiovascular: Normal rate and rhythm. S1,S2 noted.  No murmur, rubs or gallops noted. 1+ BLE edema noted. Pulmonary/Chest: Normal effort and positive vesicular breath sounds. No respiratory distress. No wheezes, rales or ronchi noted.  Neurological: Alert and oriented.      BMET    Component Value Date/Time   NA 138 10/26/2023 0832   NA  139 06/02/2012 1926   K 4.5 10/26/2023 0832   K 3.9 06/02/2012 1926   CL 102 10/26/2023 0832   CL 103 06/02/2012 1926   CO2 28 10/26/2023 0832   CO2 26 06/02/2012 1926   GLUCOSE 160 (H) 10/26/2023 0832   GLUCOSE 129 (H) 06/02/2012 1926   BUN 10 10/26/2023 0832   BUN 10 06/02/2012 1926   CREATININE 0.69 10/26/2023 0832   CALCIUM 9.5 10/26/2023 0832   CALCIUM 8.7 06/02/2012 1926   GFRNONAA 89 08/30/2020 1023   GFRAA 103 08/30/2020 1023    Lipid Panel     Component Value Date/Time   CHOL 145 10/26/2023 0832   TRIG 65 10/26/2023 0832   HDL 63  10/26/2023 0832   CHOLHDL 2.3 10/26/2023 0832   LDLCALC 68 10/26/2023 0832    CBC    Component Value Date/Time   WBC 8.4 10/26/2023 0832   RBC 5.25 (H) 10/26/2023 0832   HGB 14.7 10/26/2023 0832   HGB 14.6 06/02/2012 1926   HCT 44.9 10/26/2023 0832   HCT 42.6 06/02/2012 1926   PLT 231 10/26/2023 0832   PLT 250 06/02/2012 1926   MCV 85.5 10/26/2023 0832   MCV 85 06/02/2012 1926   MCH 28.0 10/26/2023 0832   MCHC 32.7 10/26/2023 0832   RDW 12.3 10/26/2023 0832   RDW 13.5 06/02/2012 1926   LYMPHSABS 2,936 08/30/2020 1023   LYMPHSABS 3.3 05/17/2012 0630   MONOABS 0.6 05/17/2012 0630   EOSABS 98 08/30/2020 1023   EOSABS 0.2 05/17/2012 0630   BASOSABS 41 08/30/2020 1023   BASOSABS 0.0 05/17/2012 0630    Hgb A1C Lab Results  Component Value Date   HGBA1C 7.5 (A) 10/26/2023           Assessment & Plan:   Assessment and Plan    Peripheral edema secondary to amlodipine Bilateral ankle swelling likely due to amlodipine. Discontinuing amlodipine expected to resolve edema. Hydralazine to maintain blood pressure without causing edema. - Discontinue amlodipine and olmesartan combination. - Prescribe olmesartan alone at the same dose. - Prescribe hydralazine twice daily in place of amlodipine. - Advise wearing compression socks and elevating legs. - Instruct to monitor swelling and report if not improving in a  week.  Hypertension Blood pressure well-controlled on current regimen. Medication change necessary due to edema. Hydralazine added to maintain control. - Monitor blood pressure after medication change to olmesartan and hydralazine. - Educated on adherence to new medication regimen.       RTC in 3 months for your annual exam Nicki Reaper, NP

## 2024-01-25 ENCOUNTER — Encounter: Payer: Self-pay | Admitting: Internal Medicine

## 2024-01-25 NOTE — Patient Instructions (Signed)
 Edema  Edema is when you have too much fluid in your body or under your skin. Edema may make your legs, feet, and ankles swell. Swelling often happens in looser tissues, such as around your eyes. This is a common condition. It gets more common as you get older. There are many possible causes of edema. These include: Eating too much salt (sodium). Being on your feet or sitting for a long time. Certain medical conditions, such as: Pregnancy. Heart failure. Liver disease. Kidney disease. Cancer. Hot weather may make edema worse. Edema is usually painless. Your skin may look swollen or shiny. Follow these instructions at home: Medicines Take over-the-counter and prescription medicines only as told by your doctor. Your doctor may prescribe a medicine to help your body get rid of extra water (diuretic). Take this medicine if you are told to take it. Eating and drinking Eat a low-salt (low-sodium) diet as told by your doctor. Sometimes, eating less salt may reduce swelling. Depending on the cause of your swelling, you may need to limit how much fluid you drink (fluid restriction). General instructions Raise the injured area above the level of your heart while you are sitting or lying down. Do not sit still or stand for a long time. Do not wear tight clothes. Do not wear garters on your upper legs. Exercise your legs. This can help the swelling go down. Wear compression stockings as told by your doctor. It is important that these are the right size. These should be prescribed by your doctor to prevent possible injuries. If elastic bandages or wraps are recommended, use them as told by your doctor. Contact a doctor if: Treatment is not working. You have heart, liver, or kidney disease and have symptoms of edema. You have sudden and unexplained weight gain. Get help right away if: You have shortness of breath or chest pain. You cannot breathe when you lie down. You have pain, redness, or  warmth in the swollen areas. You have heart, liver, or kidney disease and get edema all of a sudden. You have a fever and your symptoms get worse all of a sudden. These symptoms may be an emergency. Get help right away. Call 911. Do not wait to see if the symptoms will go away. Do not drive yourself to the hospital. Summary Edema is when you have too much fluid in your body or under your skin. Edema may make your legs, feet, and ankles swell. Swelling often happens in looser tissues, such as around your eyes. Raise the injured area above the level of your heart while you are sitting or lying down. Follow your doctor's instructions about diet and how much fluid you can drink. This information is not intended to replace advice given to you by your health care provider. Make sure you discuss any questions you have with your health care provider. Document Revised: 06/24/2021 Document Reviewed: 06/24/2021 Elsevier Patient Education  2024 [employer].

## 2024-01-25 NOTE — Assessment & Plan Note (Signed)
 Encourage diet and exercise for weight loss

## 2024-02-20 ENCOUNTER — Other Ambulatory Visit: Payer: Self-pay | Admitting: Internal Medicine

## 2024-02-22 NOTE — Telephone Encounter (Signed)
 Requested Prescriptions  Pending Prescriptions Disp Refills   metFORMIN  (GLUCOPHAGE ) 1000 MG tablet [Pharmacy Med Name: METFORMIN  1000MG  TABLETS] 180 tablet 0    Sig: TAKE 1 TABLET BY MOUTH TWICE DAILY WITH A MEAL     Endocrinology:  Diabetes - Biguanides Failed - 02/22/2024 11:34 AM      Failed - B12 Level in normal range and within 720 days    No results found for: "VITAMINB12"       Failed - Valid encounter within last 6 months    Recent Outpatient Visits           1 month ago Primary hypertension   [facility] [place], Rankin Buzzard, NP       Future Appointments             In 1 month [place], Rankin Buzzard, NP [facility]            Failed - CBC within normal limits and completed in the last 12 months    WBC  Date Value Ref Range Status  10/26/2023 8.4 3.8 - 10.8 Thousand/uL Final   RBC  Date Value Ref Range Status  10/26/2023 5.25 (H) 3.80 - 5.10 Million/uL Final   Hemoglobin  Date Value Ref Range Status  10/26/2023 14.7 11.7 - 15.5 g/dL Final   HGB  Date Value Ref Range Status  06/02/2012 14.6 12.0 - 16.0 g/dL Final   HCT  Date Value Ref Range Status  10/26/2023 44.9 35.0 - 45.0 % Final  06/02/2012 42.6 35.0 - 47.0 % Final   MCHC  Date Value Ref Range Status  10/26/2023 32.7 32.0 - 36.0 g/dL Final    Comment:    For adults, a slight decrease in the calculated MCHC value (in the range of 30 to 32 g/dL) is most likely not clinically significant; however, it should be interpreted with caution in correlation with other red cell parameters and the patient's clinical condition.    [facility]  Date Value Ref Range Status  10/26/2023 28.0 27.0 - 33.0 pg Final   MCV  Date Value Ref Range Status  10/26/2023 85.5 80.0 - 100.0 fL Final  06/02/2012 85 80 - 100 fL Final   No results found for: "PLTCOUNTKUC", "LABPLAT", "POCPLA" RDW  Date Value Ref Range Status  10/26/2023 12.3 11.0 - 15.0 % Final  06/02/2012  13.5 11.5 - 14.5 % Final         Passed - Cr in normal range and within 360 days    Creat  Date Value Ref Range Status  10/26/2023 0.69 0.50 - 1.03 mg/dL Final   Creatinine, Urine  Date Value Ref Range Status  04/20/2023 69 20 - 275 mg/dL Final         Passed - HBA1C is between 0 and 7.9 and within 180 days    Hemoglobin A1C  Date Value Ref Range Status  10/26/2023 7.5 (A) 4.0 - 5.6 % Final   Hgb A1c MFr Bld  Date Value Ref Range Status  04/20/2023 7.8 (H) <5.7 % of total Hgb Final    Comment:    For someone without known diabetes, a hemoglobin A1c value of 6.5% or greater indicates that they may have  diabetes and this should be confirmed with a follow-up  test. . For someone with known diabetes, a value <7% indicates  that their diabetes is well controlled and a value  greater than or equal to 7% indicates suboptimal  control. A1c targets should be individualized based on  duration of diabetes, age, comorbid conditions, and  other considerations. . Currently, no consensus exists regarding use of hemoglobin A1c for diagnosis of diabetes for children. .          Passed - eGFR in normal range and within 360 days    GFR, Est African American  Date Value Ref Range Status  08/30/2020 103 > OR = 60 mL/min/1.63m2 Final   GFR, Est Non African American  Date Value Ref Range Status  08/30/2020 89 > OR = 60 mL/min/1.53m2 Final   eGFR  Date Value Ref Range Status  10/26/2023 104 > OR = 60 mL/min/1.23m2 Final

## 2024-03-23 ENCOUNTER — Other Ambulatory Visit: Payer: Self-pay | Admitting: Internal Medicine

## 2024-03-23 DIAGNOSIS — Z0001 Encounter for general adult medical examination with abnormal findings: Secondary | ICD-10-CM

## 2024-03-23 DIAGNOSIS — E1165 Type 2 diabetes mellitus with hyperglycemia: Secondary | ICD-10-CM

## 2024-03-24 NOTE — Telephone Encounter (Signed)
 Requested Prescriptions  Refused Prescriptions Disp Refills   metFORMIN  (GLUCOPHAGE ) 500 MG tablet [Pharmacy Med Name: METFORMIN  500MG  TABLETS] 180 tablet 1    Sig: TAKE 1 TABLET(500 MG) BY MOUTH TWICE DAILY WITH MEALS     Endocrinology:  Diabetes - Biguanides Failed - 03/24/2024 11:40 AM      Failed - B12 Level in normal range and within 720 days    No results found for: "VITAMINB12"       Failed - Valid encounter within last 6 months    Recent Outpatient Visits           2 months ago Primary hypertension   [facility] [place], Rankin Buzzard, NP       Future Appointments             In 3 weeks [place], Rankin Buzzard, NP [facility]            Failed - CBC within normal limits and completed in the last 12 months    WBC  Date Value Ref Range Status  10/26/2023 8.4 3.8 - 10.8 Thousand/uL Final   RBC  Date Value Ref Range Status  10/26/2023 5.25 (H) 3.80 - 5.10 Million/uL Final   Hemoglobin  Date Value Ref Range Status  10/26/2023 14.7 11.7 - 15.5 g/dL Final   HGB  Date Value Ref Range Status  06/02/2012 14.6 12.0 - 16.0 g/dL Final   HCT  Date Value Ref Range Status  10/26/2023 44.9 35.0 - 45.0 % Final  06/02/2012 42.6 35.0 - 47.0 % Final   MCHC  Date Value Ref Range Status  10/26/2023 32.7 32.0 - 36.0 g/dL Final    Comment:    For adults, a slight decrease in the calculated MCHC value (in the range of 30 to 32 g/dL) is most likely not clinically significant; however, it should be interpreted with caution in correlation with other red cell parameters and the patient's clinical condition.    [facility]  Date Value Ref Range Status  10/26/2023 28.0 27.0 - 33.0 pg Final   MCV  Date Value Ref Range Status  10/26/2023 85.5 80.0 - 100.0 fL Final  06/02/2012 85 80 - 100 fL Final   No results found for: "PLTCOUNTKUC", "LABPLAT", "POCPLA" RDW  Date Value Ref Range Status  10/26/2023 12.3 11.0 - 15.0 % Final   06/02/2012 13.5 11.5 - 14.5 % Final         Passed - Cr in normal range and within 360 days    Creat  Date Value Ref Range Status  10/26/2023 0.69 0.50 - 1.03 mg/dL Final   Creatinine, Urine  Date Value Ref Range Status  04/20/2023 69 20 - 275 mg/dL Final         Passed - HBA1C is between 0 and 7.9 and within 180 days    Hemoglobin A1C  Date Value Ref Range Status  10/26/2023 7.5 (A) 4.0 - 5.6 % Final   Hgb A1c MFr Bld  Date Value Ref Range Status  04/20/2023 7.8 (H) <5.7 % of total Hgb Final    Comment:    For someone without known diabetes, a hemoglobin A1c value of 6.5% or greater indicates that they may have  diabetes and this should be confirmed with a follow-up  test. . For someone with known diabetes, a value <7% indicates  that their diabetes is well controlled and a value  greater than or equal to 7% indicates suboptimal  control. A1c targets should be individualized based on  duration of diabetes, age, comorbid conditions, and  other considerations. . Currently, no consensus exists regarding use of hemoglobin A1c for diagnosis of diabetes for children. .          Passed - eGFR in normal range and within 360 days    GFR, Est African American  Date Value Ref Range Status  08/30/2020 103 > OR = 60 mL/min/1.64m2 Final   GFR, Est Non African American  Date Value Ref Range Status  08/30/2020 89 > OR = 60 mL/min/1.83m2 Final   eGFR  Date Value Ref Range Status  10/26/2023 104 > OR = 60 mL/min/1.35m2 Final

## 2024-04-20 ENCOUNTER — Ambulatory Visit: Payer: Self-pay | Admitting: Internal Medicine

## 2024-04-20 NOTE — Progress Notes (Deleted)
 Subjective:    Patient ID: Elizabeth Dalton, female    DOB: 13-May-1971, 53 y.o.   MRN: [id number]  HPI  Pt presents to the clinic today for his annual exam.  Flu: 10/2023 Tetanus: 04/2018 Pneumovax: 02/2021 Covid: Moderna x 3 Shingrix: never Pap smear: 02/2021 Mammogram: 06/2023 Colon screening: 04/2021 Vision screening: annually Dentist: biannually  Diet: She does eat meat. She consumes fruits and veggies. She does eat fried foods. She drinks mostly water and coffee. Exercise: None    Review of Systems     Past Medical History:  Diagnosis Date   Allergy     Current Outpatient Medications  Medication Sig Dispense Refill   blood glucose meter kit and supplies KIT Dispense based on patient and insurance preference. Use up to four times daily as directed. (FOR ICD-9 250.00, 250.01). 1 each 0   hydrALAZINE  (APRESOLINE ) 25 MG tablet Take 1 tablet (25 mg total) by mouth 2 (two) times daily. 180 tablet 0   levocetirizine (XYZAL) 5 MG tablet Take 5 mg by mouth every evening.     metFORMIN  (GLUCOPHAGE ) 1000 MG tablet TAKE 1 TABLET BY MOUTH TWICE DAILY WITH A MEAL 180 tablet 0   MULTIPLE VITAMINS PO Take by mouth.     olmesartan  (BENICAR ) 40 MG tablet Take 1 tablet (40 mg total) by mouth daily. 90 tablet 0   rosuvastatin  (CRESTOR ) 10 MG tablet TAKE 1 TABLET(10 MG) BY MOUTH DAILY 90 tablet 1   No current facility-administered medications for this visit.    Allergies  Allergen Reactions   Latex Rash    Family History  Problem Relation Age of Onset   Lung cancer Mother    Heart disease Mother    Hypertension Mother    Diabetes Mother        T1DM   Heart disease Father    Hypertension Father    Diabetes Sister    Diabetes Sister    Dementia Maternal Grandmother    Liver disease Paternal Grandfather    Alcohol abuse Paternal Grandfather    Breast cancer Neg Hx     Social History   Socioeconomic History   Marital status: Married    Spouse name: Not on  file   Number of children: Not on file   Years of education: Not on file   Highest education level: Bachelor's degree (e.g., BA, AB, BS)  Occupational History   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  Tobacco Use   Smoking status: Never   Smokeless tobacco: Never  Vaping Use   Vaping status: Never Used  Substance and Sexual Activity   Alcohol use: Yes    Alcohol/week: 1.0 standard drink of alcohol    Types: 1 Standard drinks or equivalent per week    Comment: occasionally    Drug use: Never   Sexual activity: Yes  Other Topics Concern   Not on file  Social History Narrative   Not on file   Social Drivers of Health   Financial Resource Strain: Low Risk  (10/25/2023)   Overall Financial Resource Strain (CARDIA)    Difficulty of Paying Living Expenses: Not hard at all  Food Insecurity: No Food Insecurity (10/25/2023)   Hunger Vital Sign    Worried About Running Out of Food in the Last Year: Never true    Ran Out of Food in the Last Year: Never true  Transportation Needs: No Transportation Needs (10/25/2023)  PRAPARE - Administrator, Civil Service (Medical): No    Lack of Transportation (Non-Medical): No  Physical Activity: Insufficiently Active (10/25/2023)   Exercise Vital Sign    Days of Exercise per Week: 2 days    Minutes of Exercise per Session: 30 min  Stress: Stress Concern Present (10/25/2023)   [employer] of Occupational Health - Occupational Stress Questionnaire    Feeling of Stress : To some extent  Social Connections: Moderately Isolated (10/25/2023)   Social Connection and Isolation Panel    Frequency of Communication with Friends and Family: Once a week    Frequency of Social Gatherings with Friends and Family: Once a week    Attends Religious Services: More than 4 times per year    Active Member of [employer] or Organizations: No    Attends Engineer, structural: Not on file     Marital Status: Married  Catering manager Violence: Not At Risk (01/21/2019)   Humiliation, Afraid, Rape, and Kick questionnaire    Fear of Current or Ex-Partner: No    Emotionally Abused: No    Physically Abused: No    Sexually Abused: No     Constitutional: Denies fever, malaise, fatigue, headache or abrupt weight changes.  HEENT: Denies eye pain, eye redness, ear pain, ringing in the ears, wax buildup, runny nose, nasal congestion, bloody nose, or sore throat. Respiratory: Denies difficulty breathing, shortness of breath, cough or sputum production.   Cardiovascular: Denies chest pain, chest tightness, palpitations or swelling in the hands or feet.  Gastrointestinal: Denies abdominal pain, bloating, constipation, diarrhea or blood in the stool.  GU: Denies urgency, frequency, pain with urination, burning sensation, blood in urine, odor or discharge. Musculoskeletal: Denies decrease in range of motion, difficulty with gait, muscle pain or joint pain and swelling.  Skin: Denies redness, rashes, lesions or ulcercations.  Neurological: Denies dizziness, difficulty with memory, difficulty with speech or problems with balance and coordination.  Psych: Denies anxiety, depression, SI/HI.  No other specific complaints in a complete review of systems (except as listed in HPI above).  Objective:   Physical Exam  There were no vitals taken for this visit.  Wt Readings from Last 3 Encounters:  01/22/24 235 lb (106.6 kg)  11/23/23 235 lb (106.6 kg)  10/26/23 234 lb (106.1 kg)    General: Appears her stated age, obese, in NAD. Skin: Warm, dry and intact. No ulcerations noted. HEENT: Head: normal shape and size; Eyes: sclera white, no icterus, conjunctiva pink, PERRLA and EOMs intact;  Neck:  Neck supple, trachea midline. No masses, lumps or thyromegaly present.  Cardiovascular: Normal rate and rhythm. S1,S2 noted.  No murmur, rubs or gallops noted. No JVD or BLE edema.  Varicose veins of  BLE.  No carotid bruits noted. Pulmonary/Chest: Normal effort and positive vesicular breath sounds. No respiratory distress. No wheezes, rales or ronchi noted.  Abdomen:  Normal bowel sounds.  Musculoskeletal: Strength 5/5 BUE/BLE.  No difficulty with gait.  Neurological: Alert and oriented. Cranial nerves II-XII grossly intact. Coordination normal.  Psychiatric: Mood and affect normal. Behavior is normal. Judgment and thought content normal.     BMET    Component Value Date/Time   NA 138 10/26/2023 0832   NA 139 06/02/2012 1926   K 4.5 10/26/2023 0832   K 3.9 06/02/2012 1926   CL 102 10/26/2023 0832   CL 103 06/02/2012 1926   CO2 28 10/26/2023 0832   CO2 26 06/02/2012 1926   GLUCOSE 160 (  H) 10/26/2023 0832   GLUCOSE 129 (H) 06/02/2012 1926   BUN 10 10/26/2023 0832   BUN 10 06/02/2012 1926   CREATININE 0.69 10/26/2023 0832   CALCIUM  9.5 10/26/2023 0832   CALCIUM  8.7 06/02/2012 1926   GFRNONAA 89 08/30/2020 1023   GFRAA 103 08/30/2020 1023    Lipid Panel     Component Value Date/Time   CHOL 145 10/26/2023 0832   TRIG 65 10/26/2023 0832   HDL 63 10/26/2023 0832   CHOLHDL 2.3 10/26/2023 0832   LDLCALC 68 10/26/2023 0832    CBC    Component Value Date/Time   WBC 8.4 10/26/2023 0832   RBC 5.25 (H) 10/26/2023 0832   HGB 14.7 10/26/2023 0832   HGB 14.6 06/02/2012 1926   HCT 44.9 10/26/2023 0832   HCT 42.6 06/02/2012 1926   PLT 231 10/26/2023 0832   PLT 250 06/02/2012 1926   MCV 85.5 10/26/2023 0832   MCV 85 06/02/2012 1926   MCH 28.0 10/26/2023 0832   MCHC 32.7 10/26/2023 0832   RDW 12.3 10/26/2023 0832   RDW 13.5 06/02/2012 1926   LYMPHSABS 2,936 08/30/2020 1023   LYMPHSABS 3.3 05/17/2012 0630   MONOABS 0.6 05/17/2012 0630   EOSABS 98 08/30/2020 1023   EOSABS 0.2 05/17/2012 0630   BASOSABS 41 08/30/2020 1023   BASOSABS 0.0 05/17/2012 0630    Hgb A1C Lab Results  Component Value Date   HGBA1C 7.5 (A) 10/26/2023            Assessment & Plan:    Preventative health maintenance:  Encouraged her to get a flu shot in the fall Tetanus UTD Pneumovax UTD Prevnar 20 Encouraged her to get her covid booster Discussed shingrix vaccine, she will check coverage with her insurance company and schedule a visit if she would like to have this done Pap smear UTD Mammogram ordered, she will call to schedule Colon screening UTD Encouraged her to consume a balanced diet and exercise regimen Advised her to see an eye doctor and dentist annually Will check CBC, CMET, Lipid, A1C, urine microalbumin today  RTC in 6 months, follow up chronic conditions Helayne Lo, NP

## 2024-05-02 ENCOUNTER — Encounter: Payer: Self-pay | Admitting: Internal Medicine

## 2024-05-02 ENCOUNTER — Ambulatory Visit (INDEPENDENT_AMBULATORY_CARE_PROVIDER_SITE_OTHER): Admitting: Internal Medicine

## 2024-05-02 VITALS — BP 140/88 | Ht 67.0 in | Wt 236.4 lb

## 2024-05-02 DIAGNOSIS — Z7984 Long term (current) use of oral hypoglycemic drugs: Secondary | ICD-10-CM | POA: Diagnosis not present

## 2024-05-02 DIAGNOSIS — Z0001 Encounter for general adult medical examination with abnormal findings: Secondary | ICD-10-CM

## 2024-05-02 DIAGNOSIS — E66812 Obesity, class 2: Secondary | ICD-10-CM | POA: Diagnosis not present

## 2024-05-02 DIAGNOSIS — Z1231 Encounter for screening mammogram for malignant neoplasm of breast: Secondary | ICD-10-CM | POA: Diagnosis not present

## 2024-05-02 DIAGNOSIS — Z23 Encounter for immunization: Secondary | ICD-10-CM

## 2024-05-02 DIAGNOSIS — E1165 Type 2 diabetes mellitus with hyperglycemia: Secondary | ICD-10-CM | POA: Diagnosis not present

## 2024-05-02 DIAGNOSIS — Z6837 Body mass index (BMI) 37.0-37.9, adult: Secondary | ICD-10-CM

## 2024-05-02 MED ORDER — OLMESARTAN MEDOXOMIL 40 MG PO TABS: 40.0000 mg | ORAL_TABLET | Freq: Every day | ORAL | 1 refills | Status: DC

## 2024-05-02 MED ORDER — METFORMIN HCL 1000 MG PO TABS: 1000.0000 mg | ORAL_TABLET | Freq: Two times a day (BID) | ORAL | 1 refills | Status: AC

## 2024-05-02 MED ORDER — HYDRALAZINE HCL 25 MG PO TABS: 25.0000 mg | ORAL_TABLET | Freq: Two times a day (BID) | ORAL | 1 refills | Status: DC

## 2024-05-02 NOTE — Patient Instructions (Signed)

## 2024-05-02 NOTE — Assessment & Plan Note (Signed)
 Encourage diet and exercise for weight loss

## 2024-05-02 NOTE — Progress Notes (Signed)
 Subjective:    Patient ID: Elizabeth Dalton, female    DOB: 09/25/1971, 53 y.o.   MRN: [id number]  HPI  Pt presents to the clinic today for her annual exam.  Flu: 10/2023 Tetanus: 04/2018 Pneumovax: 02/2021 Prevnar 20: Never Covid: Moderna x 3 Shingrix: never Pap smear: 02/2021 Mammogram: 06/2023 Colon screening: 04/2021 Vision screening: annually Dentist: biannually  Diet: She does eat meat. She consumes fruits and veggies. She does eat fried foods. She drinks mostly water and coffee. Exercise: None    Review of Systems     Past Medical History:  Diagnosis Date   Allergy     Current Outpatient Medications  Medication Sig Dispense Refill   blood glucose meter kit and supplies KIT Dispense based on patient and insurance preference. Use up to four times daily as directed. (FOR ICD-9 250.00, 250.01). 1 each 0   hydrALAZINE  (APRESOLINE ) 25 MG tablet Take 1 tablet (25 mg total) by mouth 2 (two) times daily. 180 tablet 0   levocetirizine (XYZAL) 5 MG tablet Take 5 mg by mouth every evening.     metFORMIN  (GLUCOPHAGE ) 1000 MG tablet TAKE 1 TABLET BY MOUTH TWICE DAILY WITH A MEAL 180 tablet 0   MULTIPLE VITAMINS PO Take by mouth.     olmesartan  (BENICAR ) 40 MG tablet Take 1 tablet (40 mg total) by mouth daily. 90 tablet 0   rosuvastatin  (CRESTOR ) 10 MG tablet TAKE 1 TABLET(10 MG) BY MOUTH DAILY 90 tablet 1   No current facility-administered medications for this visit.    Allergies  Allergen Reactions   Latex Rash    Family History  Problem Relation Age of Onset   Lung cancer Mother    Heart disease Mother    Hypertension Mother    Diabetes Mother        T1DM   Heart disease Father    Hypertension Father    Diabetes Sister    Diabetes Sister    Dementia Maternal Grandmother    Liver disease Paternal Grandfather    Alcohol abuse Paternal Grandfather    Breast cancer Neg Hx     Social History   Socioeconomic History   Marital status: Married     Spouse name: Not on file   Number of children: Not on file   Years of education: Not on file   Highest education level: Bachelor's degree (e.g., BA, AB, BS)  Occupational History   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  Tobacco Use   Smoking status: Never   Smokeless tobacco: Never  Vaping Use   Vaping status: Never Used  Substance and Sexual Activity   Alcohol use: Yes    Alcohol/week: 1.0 standard drink of alcohol    Types: 1 Standard drinks or equivalent per week    Comment: occasionally    Drug use: Never   Sexual activity: Yes  Other Topics Concern   Not on file  Social History Narrative   Not on file   Social Drivers of Health   Financial Resource Strain: Low Risk  (10/25/2023)   Overall Financial Resource Strain (CARDIA)    Difficulty of Paying Living Expenses: Not hard at all  Food Insecurity: No Food Insecurity (10/25/2023)   Hunger Vital Sign    Worried About Running Out of Food in the Last Year: Never true    Ran Out of Food in the Last Year: Never true  Transportation Needs: No Transportation Needs (  10/25/2023)   PRAPARE - Administrator, Civil Service (Medical): No    Lack of Transportation (Non-Medical): No  Physical Activity: Insufficiently Active (10/25/2023)   Exercise Vital Sign    Days of Exercise per Week: 2 days    Minutes of Exercise per Session: 30 min  Stress: Stress Concern Present (10/25/2023)   [employer] of Occupational Health - Occupational Stress Questionnaire    Feeling of Stress : To some extent  Social Connections: Moderately Isolated (10/25/2023)   Social Connection and Isolation Panel    Frequency of Communication with Friends and Family: Once a week    Frequency of Social Gatherings with Friends and Family: Once a week    Attends Religious Services: More than 4 times per year    Active Member of [employer] or Organizations: No    Attends Hospital doctor: Not on file    Marital Status: Married  Catering manager Violence: Not At Risk (01/21/2019)   Humiliation, Afraid, Rape, and Kick questionnaire    Fear of Current or Ex-Partner: No    Emotionally Abused: No    Physically Abused: No    Sexually Abused: No     Constitutional: Denies fever, malaise, fatigue, headache or abrupt weight changes.  HEENT: Denies eye pain, eye redness, ear pain, ringing in the ears, wax buildup, runny nose, nasal congestion, bloody nose, or sore throat. Respiratory: Denies difficulty breathing, shortness of breath, cough or sputum production.   Cardiovascular: Denies chest pain, chest tightness, palpitations or swelling in the hands or feet.  Gastrointestinal: Denies abdominal pain, bloating, constipation, diarrhea or blood in the stool.  GU: Denies urgency, frequency, pain with urination, burning sensation, blood in urine, odor or discharge. Musculoskeletal: Denies decrease in range of motion, difficulty with gait, muscle pain or joint pain and swelling.  Skin: Denies redness, rashes, lesions or ulcercations.  Neurological: Denies dizziness, difficulty with memory, difficulty with speech or problems with balance and coordination.  Psych: Denies anxiety, depression, SI/HI.  No other specific complaints in a complete review of systems (except as listed in HPI above).  Objective:   Physical Exam BP (!) 148/90 (BP Location: Right Arm, Patient Position: Sitting, Cuff Size: Large)   Ht 5' 7 (1.702 m)   Wt 236 lb 6.4 oz (107.2 kg)   BMI 37.03 kg/m    Wt Readings from Last 3 Encounters:  01/22/24 235 lb (106.6 kg)  11/23/23 235 lb (106.6 kg)  10/26/23 234 lb (106.1 kg)    General: Appears her stated age, obese, in NAD. Skin: Warm, dry and intact.  She has hyperpigmented scaly scattered lesions noted to BLE.  No ulcerations noted. HEENT: Head: normal shape and size; Eyes: sclera white, no icterus, conjunctiva pink, PERRLA and EOMs intact;  Neck:   Neck supple, trachea midline. No masses, lumps or thyromegaly present.  Cardiovascular: Normal rate and rhythm. S1,S2 noted.  No murmur, rubs or gallops noted. No JVD or BLE edema.  Varicose veins of BLE.  No carotid bruits noted. Pulmonary/Chest: Normal effort and positive vesicular breath sounds. No respiratory distress. No wheezes, rales or ronchi noted.  Abdomen:  Normal bowel sounds.  Musculoskeletal: Strength 5/5 BUE/BLE.  No difficulty with gait.  Neurological: Alert and oriented. Cranial nerves II-XII grossly intact. Coordination normal.  Psychiatric: Mood and affect normal. Behavior is normal. Judgment and thought content normal.     BMET    Component Value Date/Time   NA 138 10/26/2023 0832   NA 139 06/02/2012  1926   K 4.5 10/26/2023 0832   K 3.9 06/02/2012 1926   CL 102 10/26/2023 0832   CL 103 06/02/2012 1926   CO2 28 10/26/2023 0832   CO2 26 06/02/2012 1926   GLUCOSE 160 (H) 10/26/2023 0832   GLUCOSE 129 (H) 06/02/2012 1926   BUN 10 10/26/2023 0832   BUN 10 06/02/2012 1926   CREATININE 0.69 10/26/2023 0832   CALCIUM  9.5 10/26/2023 0832   CALCIUM  8.7 06/02/2012 1926   GFRNONAA 89 08/30/2020 1023   GFRAA 103 08/30/2020 1023    Lipid Panel     Component Value Date/Time   CHOL 145 10/26/2023 0832   TRIG 65 10/26/2023 0832   HDL 63 10/26/2023 0832   CHOLHDL 2.3 10/26/2023 0832   LDLCALC 68 10/26/2023 0832    CBC    Component Value Date/Time   WBC 8.4 10/26/2023 0832   RBC 5.25 (H) 10/26/2023 0832   HGB 14.7 10/26/2023 0832   HGB 14.6 06/02/2012 1926   HCT 44.9 10/26/2023 0832   HCT 42.6 06/02/2012 1926   PLT 231 10/26/2023 0832   PLT 250 06/02/2012 1926   MCV 85.5 10/26/2023 0832   MCV 85 06/02/2012 1926   MCH 28.0 10/26/2023 0832   MCHC 32.7 10/26/2023 0832   RDW 12.3 10/26/2023 0832   RDW 13.5 06/02/2012 1926   LYMPHSABS 2,936 08/30/2020 1023   LYMPHSABS 3.3 05/17/2012 0630   MONOABS 0.6 05/17/2012 0630   EOSABS 98 08/30/2020 1023   EOSABS 0.2  05/17/2012 0630   BASOSABS 41 08/30/2020 1023   BASOSABS 0.0 05/17/2012 0630    Hgb A1C Lab Results  Component Value Date   HGBA1C 7.5 (A) 10/26/2023            Assessment & Plan:   Preventative health maintenance:  Encouraged her to get a flu shot in the fall Tetanus UTD Pneumovax UTD Prevnar 20 today Encouraged her to get her covid booster Discussed shingrix vaccine, she will check coverage with her insurance company and schedule a visit if she would like to have this done Pap smear UTD Mammogram ordered, she will call to schedule Colon screening UTD Encouraged her to consume a balanced diet and exercise regimen Advised her to see an eye doctor and dentist annually Will check CBC, CMET, Lipid, A1C, urine microalbumin today  RTC in 6 months, follow up chronic conditions Angeline Laura, NP

## 2024-05-03 ENCOUNTER — Ambulatory Visit: Payer: Self-pay | Admitting: Internal Medicine

## 2024-05-03 DIAGNOSIS — R748 Abnormal levels of other serum enzymes: Secondary | ICD-10-CM

## 2024-05-03 LAB — CBC
HCT: 43.6 % (ref 35.0–45.0)
Hemoglobin: 14.5 g/dL (ref 11.7–15.5)
MCH: 29.1 pg (ref 27.0–33.0)
MCHC: 33.3 g/dL (ref 32.0–36.0)
MCV: 87.6 fL (ref 80.0–100.0)
MPV: 9.8 fL (ref 7.5–12.5)
Platelets: 198 10*3/uL (ref 140–400)
RBC: 4.98 10*6/uL (ref 3.80–5.10)
RDW: 12.5 % (ref 11.0–15.0)
WBC: 8.5 10*3/uL (ref 3.8–10.8)

## 2024-05-03 LAB — HEMOGLOBIN A1C
Hgb A1c MFr Bld: 8 % — ABNORMAL HIGH (ref <5.7)
Mean Plasma Glucose: 183 mg/dL
eAG (mmol/L): 10.1 mmol/L

## 2024-05-03 LAB — MICROALBUMIN / CREATININE URINE RATIO
Creatinine, Urine: 220 mg/dL (ref 20–275)
Microalb Creat Ratio: 4 mg/g{creat} (ref <30)
Microalb, Ur: 0.9 mg/dL

## 2024-05-03 LAB — COMPREHENSIVE METABOLIC PANEL WITH GFR
AG Ratio: 1.4 (calc) (ref 1.0–2.5)
ALT: 72 U/L — ABNORMAL HIGH (ref 6–29)
AST: 48 U/L — ABNORMAL HIGH (ref 10–35)
Albumin: 4.1 g/dL (ref 3.6–5.1)
Alkaline phosphatase (APISO): 69 U/L (ref 37–153)
BUN: 11 mg/dL (ref 7–25)
CO2: 26 mmol/L (ref 20–32)
Calcium: 9.2 mg/dL (ref 8.6–10.4)
Chloride: 103 mmol/L (ref 98–110)
Creat: 0.75 mg/dL (ref 0.50–1.03)
Globulin: 2.9 g/dL (ref 1.9–3.7)
Glucose, Bld: 157 mg/dL — ABNORMAL HIGH (ref 65–99)
Potassium: 4.7 mmol/L (ref 3.5–5.3)
Sodium: 138 mmol/L (ref 135–146)
Total Bilirubin: 0.5 mg/dL (ref 0.2–1.2)
Total Protein: 7 g/dL (ref 6.1–8.1)
eGFR: 95 mL/min/{1.73_m2} (ref >=60)

## 2024-05-03 LAB — LIPID PANEL
Cholesterol: 156 mg/dL (ref <200)
HDL: 58 mg/dL (ref >=50)
LDL Cholesterol (Calc): 80 mg/dL
Non-HDL Cholesterol (Calc): 98 mg/dL (ref <130)
Total CHOL/HDL Ratio: 2.7 (calc) (ref <5.0)
Triglycerides: 101 mg/dL (ref <150)

## 2024-05-05 MED ORDER — TIRZEPATIDE 2.5 MG/0.5ML ~~LOC~~ SOAJ: 2.5000 mg | SUBCUTANEOUS | 0 refills | Status: DC

## 2024-05-10 ENCOUNTER — Ambulatory Visit
Admission: RE | Admit: 2024-05-10 | Discharge: 2024-05-10 | Disposition: A | Discharge status: Home / Self Care | Source: Ambulatory Visit | Attending: Internal Medicine | Admitting: Internal Medicine

## 2024-05-10 ENCOUNTER — Ambulatory Visit: Payer: Self-pay | Admitting: Internal Medicine

## 2024-05-10 DIAGNOSIS — R748 Abnormal levels of other serum enzymes: Secondary | ICD-10-CM | POA: Diagnosis present

## 2024-05-12 ENCOUNTER — Telehealth: Payer: Self-pay

## 2024-05-12 ENCOUNTER — Other Ambulatory Visit (HOSPITAL_COMMUNITY): Payer: Self-pay

## 2024-05-12 NOTE — Telephone Encounter (Signed)
 Pharmacy Patient Advocate Encounter  Received notification from [facility] that Prior Authorization for Mounjaro  2.5MG /0.5ML auto-injectors  has been APPROVED from 05/12/2024 to 05/13/2027. Ran test claim, Copay is $30.00. This test claim was processed through [facility]- copay amounts may vary at other pharmacies due to pharmacy/plan contracts, or as the patient moves through the different stages of their insurance plan.   PA #/Case ID/Reference #: [id number]

## 2024-05-12 NOTE — Telephone Encounter (Signed)
 Pharmacy Patient Advocate Encounter   Received notification from CoverMyMeds that prior authorization for Mounjaro  2.5MG /0.5ML auto-injectors is required/requested.   Insurance verification completed.   The patient is insured through [facility] ADVANTAGE/RX ADVANCE .   Per test claim: PA required; PA submitted to above mentioned insurance via CoverMyMeds Key/confirmation #/EOC [id number] Status is pending

## 2024-06-06 ENCOUNTER — Encounter: Payer: Self-pay | Admitting: Radiology

## 2024-06-06 ENCOUNTER — Ambulatory Visit
Admission: RE | Admit: 2024-06-06 | Discharge: 2024-06-06 | Disposition: A | Discharge status: Home / Self Care | Source: Ambulatory Visit | Attending: Internal Medicine | Admitting: Internal Medicine

## 2024-06-06 DIAGNOSIS — Z1231 Encounter for screening mammogram for malignant neoplasm of breast: Secondary | ICD-10-CM | POA: Diagnosis present

## 2024-06-19 ENCOUNTER — Other Ambulatory Visit: Payer: Self-pay | Admitting: Internal Medicine

## 2024-06-21 NOTE — Telephone Encounter (Signed)
 Requested medication (s) are due for refill today: yes  Requested medication (s) are on the active medication list: yes  Last refill:  05/05/24  Future visit scheduled: yes  Notes to clinic:  Medication not assigned to a protocol, review manually.      Requested Prescriptions  Pending Prescriptions Disp Refills   MOUNJARO  2.5 MG/0.5ML Pen [Pharmacy Med Name: MOUNJARO  2.5MG /0.5ML INJ ( 4 PENS)] 2 mL 0    Sig: ADMINISTER 2.5 MG UNDER THE SKIN 1 TIME A WEEK     Off-Protocol Failed - 06/21/2024  4:27 PM      Failed - Medication not assigned to a protocol, review manually.      Passed - Valid encounter within last 12 months    Recent Outpatient Visits           1 month ago Encounter for general adult medical examination with abnormal findings   [place] [facility] [place], Angeline ORN, NP   5 months ago Primary hypertension   [place] [facility] [place], Angeline ORN, [place]

## 2024-06-24 MED ORDER — TIRZEPATIDE 5 MG/0.5ML ~~LOC~~ SOAJ
5.0000 mg | SUBCUTANEOUS | 0 refills | Status: DC
Start: 2024-06-24 — End: 2024-08-17

## 2024-06-24 NOTE — Addendum Note (Signed)
 Addended by: ANTONETTE ANGELINE ORN on: 06/24/2024 10:39 AM   Modules accepted: Orders

## 2024-08-05 ENCOUNTER — Telehealth: Payer: Self-pay

## 2024-08-05 ENCOUNTER — Ambulatory Visit: Admitting: Internal Medicine

## 2024-08-05 NOTE — Telephone Encounter (Signed)
 Left message for patient to return call.

## 2024-08-05 NOTE — Telephone Encounter (Signed)
 Yes, she will need labs.  Lipid, A1c and c-Met

## 2024-08-05 NOTE — Telephone Encounter (Signed)
 Copied from CRM [id number]. Topic: Appointments - Appointment Cancel/Reschedule >> Aug 04, 2024  4:35 PM Alfonso ORN wrote: Pt called to reschedule appt for followup. She has a free day next Thursday but is wanting to know if labs are part of this followup and if so is she able to break up the appointment and do labs prior to follow up visit.

## 2024-08-11 ENCOUNTER — Other Ambulatory Visit

## 2024-08-12 ENCOUNTER — Ambulatory Visit: Payer: Self-pay | Admitting: Internal Medicine

## 2024-08-12 LAB — COMPREHENSIVE METABOLIC PANEL WITH GFR
AG Ratio: 1.6 (calc) (ref 1.0–2.5)
ALT: 27 U/L (ref 6–29)
AST: 26 U/L (ref 10–35)
Albumin: 4.5 g/dL (ref 3.6–5.1)
Alkaline phosphatase (APISO): 50 U/L (ref 37–153)
BUN: 16 mg/dL (ref 7–25)
CO2: 27 mmol/L (ref 20–32)
Calcium: 9.8 mg/dL (ref 8.6–10.4)
Chloride: 103 mmol/L (ref 98–110)
Creat: 0.93 mg/dL (ref 0.50–1.03)
Globulin: 2.9 g/dL (ref 1.9–3.7)
Glucose, Bld: 87 mg/dL (ref 65–99)
Potassium: 4.3 mmol/L (ref 3.5–5.3)
Sodium: 139 mmol/L (ref 135–146)
Total Bilirubin: 0.8 mg/dL (ref 0.2–1.2)
Total Protein: 7.4 g/dL (ref 6.1–8.1)
eGFR: 73 mL/min/1.73m2 (ref >=60)

## 2024-08-12 LAB — HEMOGLOBIN A1C
Hgb A1c MFr Bld: 6.3 % — ABNORMAL HIGH (ref <5.7)
Mean Plasma Glucose: 134 mg/dL
eAG (mmol/L): 7.4 mmol/L

## 2024-08-12 LAB — LIPID PANEL
Cholesterol: 108 mg/dL (ref <200)
HDL: 51 mg/dL (ref >=50)
LDL Cholesterol (Calc): 41 mg/dL
Non-HDL Cholesterol (Calc): 57 mg/dL (ref <130)
Total CHOL/HDL Ratio: 2.1 (calc) (ref <5.0)
Triglycerides: 81 mg/dL (ref <150)

## 2024-08-17 ENCOUNTER — Ambulatory Visit: Admitting: Internal Medicine

## 2024-08-17 ENCOUNTER — Encounter: Payer: Self-pay | Admitting: Internal Medicine

## 2024-08-17 VITALS — BP 138/78 | Ht 67.0 in | Wt 215.0 lb

## 2024-08-17 DIAGNOSIS — Z23 Encounter for immunization: Secondary | ICD-10-CM | POA: Diagnosis not present

## 2024-08-17 DIAGNOSIS — E785 Hyperlipidemia, unspecified: Secondary | ICD-10-CM

## 2024-08-17 DIAGNOSIS — I152 Hypertension secondary to endocrine disorders: Secondary | ICD-10-CM

## 2024-08-17 DIAGNOSIS — E1159 Type 2 diabetes mellitus with other circulatory complications: Secondary | ICD-10-CM

## 2024-08-17 DIAGNOSIS — G8929 Other chronic pain: Secondary | ICD-10-CM

## 2024-08-17 DIAGNOSIS — Z7985 Long-term (current) use of injectable non-insulin antidiabetic drugs: Secondary | ICD-10-CM

## 2024-08-17 DIAGNOSIS — E66811 Obesity, class 1: Secondary | ICD-10-CM

## 2024-08-17 DIAGNOSIS — E1169 Type 2 diabetes mellitus with other specified complication: Secondary | ICD-10-CM | POA: Diagnosis not present

## 2024-08-17 DIAGNOSIS — Z7984 Long term (current) use of oral hypoglycemic drugs: Secondary | ICD-10-CM

## 2024-08-17 DIAGNOSIS — E1165 Type 2 diabetes mellitus with hyperglycemia: Secondary | ICD-10-CM

## 2024-08-17 DIAGNOSIS — M25561 Pain in right knee: Secondary | ICD-10-CM

## 2024-08-17 DIAGNOSIS — K760 Fatty (change of) liver, not elsewhere classified: Secondary | ICD-10-CM

## 2024-08-17 MED ORDER — TIRZEPATIDE 7.5 MG/0.5ML ~~LOC~~ SOAJ: 7.5000 mg | SUBCUTANEOUS | 0 refills | Status: DC

## 2024-08-17 NOTE — Assessment & Plan Note (Addendum)
  Complicated by obesity A1c reviewed Urine microalbumin has been checked within the last year Continue metformin  1000 mg twice daily Will increase mounjaro  to 7.5 mg weekly Encourage low-carb diet and exercise for weight loss Encourage routine eye exam Encourage routine foot exam Flu shot today Pneumovax and Prevnar UTD Encouraged her to get her COVID booster

## 2024-08-17 NOTE — Assessment & Plan Note (Signed)
 Liver function reviewed Encouraged low saturated fat and exercise for weight loss

## 2024-08-17 NOTE — Assessment & Plan Note (Signed)
 Complicated by obesity Lipid profile reviewed Encouraged her to consume low-fat diet Continue rosuvastatin  10 mg daily

## 2024-08-17 NOTE — Assessment & Plan Note (Signed)
 Complicated by obesity Reasonable control on olmesartan  40 mg daily and hydralazine  25 mg twice daily Reinforced DASH diet and exercise for weight loss

## 2024-08-17 NOTE — Progress Notes (Signed)
 Subjective:    Patient ID: Elizabeth Dalton, female    DOB: 24-Feb-1971, 53 y.o.   MRN: [id number]  HPI   Discussed the use of AI scribe software for clinical note transcription with the patient, who gave verbal consent to proceed. Elizabeth Dalton is a 53 year old female with chronic right knee pain who presents with knee instability and pain.  She has been experiencing instability and pain in her right knee since spring 2023. The knee occasionally hyperextends, causing shooting pain and sometimes buckling. 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 No swelling or recent injury to the knee is noted.  An x-ray in July 2023 revealed minimal peripheral lateral patellar and lateral femoral condyle degenerative osteophytes without joint space narrowing. She has not yet undergone an MRI or physical therapy for the knee.  She uses two knee braces and occasionally applies voltaren cream for pain relief. 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  She is also due for follow-up of chronic conditions.  HTN: Her BP today is 1 38/78.  She is taking olmesartan  and hydralazine  as prescribed.  ECG from 05/2012 reviewed.  DM2: Her last A1c was 6.3 %, 08/2024.  She is taking metformin  and mounjaro  as prescribed.  She thinks she would benefit from a mounjaro  adjustment in dose.  She does not check her sugars.  She checks her feet routinely.  Her last eye exam was 06/2023.  Flu 07/2021.  Pneumovax 02/2021.  Prevnar 04/2024.  COVID x 3.  HLD: Her last LDL was 41 triglycerides 81, 08/2024.  She denies myalgias on rosuvastatin .  She tries to consume low-fat diet.  Fatty liver disease: Her last AST/ALT was 26/27, 08/2024.  Ultrasound from 05/2024 reviewed.  Review of Systems     Past Medical History:  Diagnosis Date   Allergy      Current Outpatient Medications  Medication Sig Dispense Refill   blood glucose meter kit and supplies KIT Dispense based on patient and insurance preference. Use up to four times daily as directed. (FOR ICD-9 250.00, 250.01). 1 each 0   hydrALAZINE  (APRESOLINE ) 25 MG tablet Take 1 tablet (25 mg total) by mouth 2 (two) times daily. 180 tablet 1   levocetirizine (XYZAL) 5 MG tablet Take 5 mg by mouth every evening.     metFORMIN  (GLUCOPHAGE ) 1000 MG tablet Take 1 tablet (1,000 mg total) by mouth 2 (two) times daily with a meal. 180 tablet 1   MULTIPLE VITAMINS PO Take by mouth.     olmesartan  (BENICAR ) 40 MG tablet Take 1 tablet (40 mg total) by mouth daily. 90 tablet 1   rosuvastatin  (CRESTOR ) 10 MG tablet TAKE 1 TABLET(10 MG) BY MOUTH DAILY 90 tablet 1   tirzepatide  (MOUNJARO ) 5 MG/0.5ML Pen Inject 5 mg into the skin once a week. 6 mL 0   No current facility-administered medications for this visit.    Allergies  Allergen Reactions   Latex Rash    Family History  Problem Relation Age of Onset   Lung cancer Mother    Heart disease Mother    Hypertension Mother    Diabetes Mother        T1DM   Heart disease Father    Hypertension Father    Diabetes Sister    Diabetes Sister    Dementia  Maternal Grandmother    Liver disease Paternal Grandfather    Alcohol abuse Paternal Grandfather    Breast cancer Neg Hx     Social History   Socioeconomic History   Marital status: Married    Spouse name: Not on file   Number of children: Not on file   Years of education: Not on file   Highest education level: Bachelor's degree (e.g., BA, AB, BS)  Occupational History   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  Tobacco Use   Smoking status: Never   Smokeless tobacco: Never  Vaping Use   Vaping status: Never Used  Substance and Sexual Activity   Alcohol use: Yes    Alcohol/week: 1.0 standard drink of alcohol    Types: 1  Standard drinks or equivalent per week    Comment: occasionally    Drug use: Never   Sexual activity: Yes  Other Topics Concern   Not on file  Social History Narrative   Not on file   Social Drivers of Health   Financial Resource Strain: Low Risk  (10/25/2023)   Overall Financial Resource Strain (CARDIA)    Difficulty of Paying Living Expenses: Not hard at all  Food Insecurity: No Food Insecurity (10/25/2023)   Hunger Vital Sign    Worried About Running Out of Food in the Last Year: Never true    Ran Out of Food in the Last Year: Never true  Transportation Needs: No Transportation Needs (10/25/2023)   PRAPARE - Administrator, Civil Service (Medical): No    Lack of Transportation (Non-Medical): No  Physical Activity: Insufficiently Active (10/25/2023)   Exercise Vital Sign    Days of Exercise per Week: 2 days    Minutes of Exercise per Session: 30 min  Stress: Stress Concern Present (10/25/2023)   [employer] of Occupational Health - Occupational Stress Questionnaire    Feeling of Stress : To some extent  Social Connections: Moderately Isolated (10/25/2023)   Social Connection and Isolation Panel    Frequency of Communication with Friends and Family: Once a week    Frequency of Social Gatherings with Friends and Family: Once a week    Attends Religious Services: More than 4 times per year    Active Member of [employer] or Organizations: No    Attends Engineer, structural: Not on file    Marital Status: Married  Catering manager Violence: Not At Risk (01/21/2019)   Humiliation, Afraid, Rape, and Kick questionnaire    Fear of Current or Ex-Partner: No    Emotionally Abused: No    Physically Abused: No    Sexually Abused: No     Constitutional: Denies fever, malaise, fatigue, headache or abrupt weight changes.  HEENT: Denies eye pain, eye redness, ear pain, ringing in the ears, wax buildup, runny nose, nasal congestion, bloody nose, or sore  throat. Respiratory: Denies difficulty breathing, shortness of breath, cough or sputum production.   Cardiovascular: Denies chest pain, chest tightness, palpitations or swelling in the hands or feet.  Gastrointestinal: Patient reports intermittent nausea (appetite induced).  Denies abdominal pain, bloating, constipation, diarrhea or blood in the stool.  GU: Denies urgency, frequency, pain with urination, burning sensation, blood in urine, odor or discharge. Musculoskeletal: Patient reports right knee pain.  Denies decrease in range of motion, difficulty with gait, muscle pain or joint swelling.  Skin: Denies redness, rashes, lesions or ulcercations.  Neurological: Denies dizziness, difficulty with  memory, difficulty with speech or problems with balance and coordination.  Psych: Denies anxiety, depression, SI/HI.  No other specific complaints in a complete review of systems (except as listed in HPI above).  Objective:   Physical Exam BP 138/78 (BP Location: Left Arm, Patient Position: Sitting, Cuff Size: Normal)   Ht 5' 7 (1.702 m)   Wt 215 lb (97.5 kg)   LMP  (LMP Unknown)   BMI 33.67 kg/m     Wt Readings from Last 3 Encounters:  05/02/24 236 lb 6.4 oz (107.2 kg)  01/22/24 235 lb (106.6 kg)  11/23/23 235 lb (106.6 kg)    General: Appears her stated age, obese, in NAD. Skin: Warm, dry and intact.  No ulcerations noted. Cardiovascular: Normal rate and rhythm. S1,S2 noted.  No murmur, rubs or gallops noted. No JVD or BLE edema.  Varicose veins of BLE.  No carotid bruits noted. Pulmonary/Chest: Normal effort and positive vesicular breath sounds. No respiratory distress. No wheezes, rales or ronchi noted.  Musculoskeletal: Normal flexion and extension of the right knee.  Joint enlargement and effusion noted of the right knee.  Negative Lachman's.  Negative McMurray.  Strength 5/5 BLE.  No difficulty with gait.  Neurological: Alert and oriented. Coordination normal.    BMET     Component Value Date/Time   NA 139 08/11/2024 1605   NA 139 06/02/2012 1926   K 4.3 08/11/2024 1605   K 3.9 06/02/2012 1926   CL 103 08/11/2024 1605   CL 103 06/02/2012 1926   CO2 27 08/11/2024 1605   CO2 26 06/02/2012 1926   GLUCOSE 87 08/11/2024 1605   GLUCOSE 129 (H) 06/02/2012 1926   BUN 16 08/11/2024 1605   BUN 10 06/02/2012 1926   CREATININE 0.93 08/11/2024 1605   CALCIUM  9.8 08/11/2024 1605   CALCIUM  8.7 06/02/2012 1926   GFRNONAA 89 08/30/2020 1023   GFRAA 103 08/30/2020 1023    Lipid Panel     Component Value Date/Time   CHOL 108 08/11/2024 1605   TRIG 81 08/11/2024 1605   HDL 51 08/11/2024 1605   CHOLHDL 2.1 08/11/2024 1605   LDLCALC 41 08/11/2024 1605    CBC    Component Value Date/Time   WBC 8.5 05/02/2024 0931   RBC 4.98 05/02/2024 0931   HGB 14.5 05/02/2024 0931   HGB 14.6 06/02/2012 1926   HCT 43.6 05/02/2024 0931   HCT 42.6 06/02/2012 1926   PLT 198 05/02/2024 0931   PLT 250 06/02/2012 1926   MCV 87.6 05/02/2024 0931   MCV 85 06/02/2012 1926   MCH 29.1 05/02/2024 0931   MCHC 33.3 05/02/2024 0931   RDW 12.5 05/02/2024 0931   RDW 13.5 06/02/2012 1926   LYMPHSABS 2,936 08/30/2020 1023   LYMPHSABS 3.3 05/17/2012 0630   MONOABS 0.6 05/17/2012 0630   EOSABS 98 08/30/2020 1023   EOSABS 0.2 05/17/2012 0630   BASOSABS 41 08/30/2020 1023   BASOSABS 0.0 05/17/2012 0630    Hgb A1C Lab Results  Component Value Date   HGBA1C 6.3 (H) 08/11/2024            Assessment & Plan:   Assessment and Plan    Right knee osteoarthritis with instability and pain Chronic right knee pain with episodes of hyperextension and instability, likely due to osteoarthritis. Differential includes meniscal irritation or tear due to bone spurs. MRI may be needed to assess ligaments and tendons, but physical therapy is likely the primary initial treatment. - Refer to sports medicine specialist  Dr. Selinda Ku for further evaluation and management. - Continue using  Voltaren gel as needed for pain. - Discuss potential for physical therapy vs MRI based on sports medicine evaluation.    RTC in 3 months, follow up chronic conditions Angeline Laura, NP

## 2024-08-17 NOTE — Patient Instructions (Signed)
 Exercises for Chronic Knee Pain  Chronic knee pain is pain that lasts longer than 3 months. For most people with chronic knee pain, exercise and weight loss is an important part of treatment. Your health care provider may want you to focus on:  Making the muscles that support your knee stronger. This can take pressure off your knee and reduce pain.  Preventing knee stiffness.  How far you can move your knee, keeping it there or making it farther.  Losing weight (if this applies) to take pressure off your knee, lower your risk for injury, and make it easier for you to exercise.  Your provider will help you make an exercise program that fits your needs and physical abilities. Below are simple, low-impact exercises you can do at home. Ask your provider or physical therapist how often you should do your exercise program and how many times to repeat each exercise.  General safety tips    Get your provider's approval before doing any exercises.  Start slowly and stop any time you feel pain.  Do not exercise if your knee pain is flaring up.  Warm up first. Stretching a cold muscle can cause an injury. Do 5-10 minutes of easy movement or light stretching before beginning your exercises.  Do 5-10 minutes of low-impact activity (like walking or cycling) before starting strengthening exercises.  Contact your provider any time you have pain during or after exercising. Exercise can cause discomfort but should not be painful. It is normal to be a little stiff or sore after exercising.  Stretching and range-of-motion exercises  Front thigh stretch    Stand up straight and support your body by holding on to a chair or resting one hand on a Mastandrea.  With your legs straight and close together, bend one knee to lift your heel up toward your butt.  Using one hand for support, grab your ankle with your free hand.  Pull your foot up closer toward your butt to feel the stretch in front of your thigh.  Hold the stretch for 30  seconds.  Repeat __________ times. Complete this exercise __________ times a day.  Back thigh stretch    Sit on the floor with your back straight and your legs out straight in front of you.  Place the palms of your hands on the floor and slide them toward your feet as you bend at the hip.  Try to touch your nose to your knees and feel the stretch in the back of your thighs.  Hold for 30 seconds.  Repeat __________ times. Complete this exercise __________ times a day.  Calf stretch    Stand facing a Sheehy.  Place the palms of your hands flat against the Ghazi, arms extended, and lean slightly against the Muha.  Get into a lunge position with one leg bent at the knee and the other leg stretched out straight behind you.  Keep both feet facing the Ziesmer and increase the bend in your knee while keeping the heel of the other leg flat on the ground.  You should feel the stretch in your calf. Hold for 30 seconds.  Repeat __________ times. Complete this exercise __________ times a day.  Strengthening exercises  Straight leg lift    Lie on your back with one knee bent and the other leg out straight.  Slowly lift the straight leg without bending the knee.  Lift until your foot is about 12 inches (30 cm) off the floor.  Hold for  3-5 seconds and slowly lower your leg.  Repeat __________ times. Complete this exercise __________ times a day.  Single leg dip    Stand between two chairs and put both hands on the backs of the chairs for support.  Extend one leg out straight with your body weight resting on the heel of the standing leg.  Slowly bend your standing knee to dip your body to the level that is comfortable for you.  Hold for 3-5 seconds.  Repeat __________ times. Complete this exercise __________ times a day.  Hamstring curls    Stand straight, knees close together, facing the back of a chair.  Hold on to the back of a chair with both hands.  Keep one leg straight. Bend the other knee while bringing the heel up toward the butt  until the knee is bent at a 90-degree angle (right angle).  Hold for 3-5 seconds.  Repeat __________ times. Complete this exercise __________ times a day.  Neece squat    Stand straight with your back, hips, and head against a Rushing.  Step forward one foot at a time with your back still against the Spargur.  Your feet should be 2 feet (61 cm) from the Jeanpaul at shoulder width. Keeping your back, hips, and head against the Colligan, slide down the Brenner to as close to a sitting position as you can get.  Hold for 5-10 seconds, then slowly slide back up.  Repeat __________ times. Complete this exercise __________ times a day.  Step-ups    Stand in front of a sturdy platform or stool that is about 6 inches (15 cm) high.  Slowly step up with your left / right foot, keeping your knee in line with your hip and foot. Do not let your knee bend so far that you cannot see your toes. Hold on to a chair for balance, but do not use it for support.  Slowly unlock your knee and lower yourself to the starting position.  Repeat __________ times. Complete this exercise __________ times a day.  Contact a health care provider if:  Your exercises cause pain.  Your pain is worse after you exercise.  Your pain prevents you from doing your exercises.  This information is not intended to replace advice given to you by your health care provider. Make sure you discuss any questions you have with your health care provider.  Document Revised: 11/04/2022 Document Reviewed: 11/04/2022  Elsevier Patient Education  2024 [employer].

## 2024-08-17 NOTE — Assessment & Plan Note (Signed)
 Encourage diet and exercise for weight loss

## 2024-08-30 ENCOUNTER — Ambulatory Visit: Admitting: Family Medicine

## 2024-08-30 ENCOUNTER — Encounter: Payer: Self-pay | Admitting: Family Medicine

## 2024-08-30 VITALS — BP 120/72 | HR 90 | Ht 67.0 in | Wt 215.0 lb

## 2024-08-30 DIAGNOSIS — M25561 Pain in right knee: Secondary | ICD-10-CM | POA: Insufficient documentation

## 2024-08-30 DIAGNOSIS — G8929 Other chronic pain: Secondary | ICD-10-CM | POA: Insufficient documentation

## 2024-08-30 MED ORDER — MELOXICAM 15 MG PO TABS: 15.0000 mg | ORAL_TABLET | Freq: Every day | ORAL | 1 refills | Status: AC

## 2024-09-02 ENCOUNTER — Encounter: Payer: Self-pay | Admitting: Family Medicine

## 2024-09-02 NOTE — Assessment & Plan Note (Signed)
 Patient presents with chronic right knee pain primarily medially with additionally anterolaterally at times.  Medial knee pain has improved just recently which she attributes to significant weight loss secondary to Mounjaro  for her diabetes.  Despite this notes residual anterior pain, primarily with bending, squatting, prolonged walking not as symptomatic.  Examination demonstrates focality to the patellofemoral compartment with evidence of dynamic maltracking, McMurray localizes medially, otherwise benign.  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  Given chronicity and somewhat recalcitrant nature, advanced imaging to be coordinated. - Dose meloxicam daily x 1 week then daily as needed, take with food - Referral coordinator will contact to schedule MRI - Referral to PT has been placed

## 2024-09-02 NOTE — Progress Notes (Signed)
     Primary Care / Sports Medicine Office Visit  Patient Information:  Patient ID: Elizabeth Dalton, female DOB: 1970-11-28 Age: 53 y.o. MRN: [id number]   Elizabeth Dalton is a pleasant 53 y.o. female presenting with the following:  Chief Complaint  Patient presents with   Knee Pain    Chronic right knee pain. Swelling, clicking and popping with instability. Patient wears knee brace occasionally when in pain. Patient would like to discuss treatment options. No PT no recent xray's. Pain is mostly on medial aspect but also on lateral.     Vitals:   08/30/24 1607  BP: 120/72  Pulse: 90  SpO2: 99%   Vitals:   08/30/24 1607  Weight: 215 lb (97.5 kg)  Height: 5' 7 (1.702 m)   Body mass index is 33.67 kg/m.  No results found.   Discussed the use of AI scribe software for clinical note transcription with the patient, who gave verbal consent to proceed.   Independent interpretation of notes and tests performed by another provider:   None  Procedures performed:   None  Pertinent History, Exam, Impression, and Recommendations:   Problem List Items Addressed This Visit     Arthralgia of knee, right - Primary   Patient presents with chronic right knee pain primarily medially with additionally anterolaterally at times.  Medial knee pain has improved just recently which she attributes to significant weight loss secondary to Mounjaro  for her diabetes.  Despite this notes residual anterior pain, primarily with bending, squatting, prolonged walking not as symptomatic.  Examination demonstrates focality to the patellofemoral compartment with evidence of dynamic maltracking, McMurray localizes medially, otherwise benign.  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  Given chronicity and somewhat recalcitrant nature, advanced imaging to be coordinated. - Dose meloxicam daily x 1 week  then daily as needed, take with food - Referral coordinator will contact to schedule MRI - Referral to PT has been placed      Relevant Medications   meloxicam (MOBIC) 15 MG tablet   Other Relevant Orders   MR Knee Right Wo Contrast   Ambulatory referral to Physical Therapy     Orders & Medications Medications:  Meds ordered this encounter  Medications   meloxicam (MOBIC) 15 MG tablet    Sig: Take 1 tablet (15 mg total) by mouth daily. X 1 week then daily PRN    Dispense:  30 tablet    Refill:  1   Orders Placed This Encounter  Procedures   MR Knee Right Wo Contrast   Ambulatory referral to Physical Therapy     No follow-ups on file.     Elizabeth JINNY Ku, MD, [facility]   Primary Care Sports Medicine [facility] at [facility]

## 2024-09-02 NOTE — Patient Instructions (Signed)
-   Dose meloxicam daily x 1 week then daily as needed, take with food - Referral coordinator will contact to schedule MRI - Referral to PT has been placed

## 2024-09-07 ENCOUNTER — Ambulatory Visit
Admission: RE | Admit: 2024-09-07 | Discharge: 2024-09-07 | Disposition: A | Discharge status: Home / Self Care | Source: Ambulatory Visit | Attending: Family Medicine | Admitting: Family Medicine

## 2024-09-07 DIAGNOSIS — M25561 Pain in right knee: Secondary | ICD-10-CM | POA: Insufficient documentation

## 2024-09-14 ENCOUNTER — Ambulatory Visit: Payer: Self-pay | Admitting: Family Medicine

## 2024-09-27 ENCOUNTER — Other Ambulatory Visit: Payer: Self-pay | Admitting: Internal Medicine

## 2024-09-30 NOTE — Telephone Encounter (Signed)
 Requested medication (s) are due for refill today: Yes  Requested medication (s) are on the active medication list: Yes  Last refill:  08/17/24  Future visit scheduled: Yes  Notes to clinic:   Requested Prescriptions  Pending Prescriptions Disp Refills   MOUNJARO  7.5 MG/0.5ML Pen [Pharmacy Med Name: MOUNJARO  7.5MG /0.5ML INJ (4 PENS)] 2 mL 0    Sig: ADMINISTER 7.5 MG UNDER THE SKIN 1 TIME A WEEK     Off-Protocol Failed - 09/30/2024  4:07 PM      Failed - Medication not assigned to a protocol, review manually.      Passed - Valid encounter within last 12 months    Recent Outpatient Visits           1 month ago Arthralgia of knee, right   [place] [facility], Selinda PARAS, MD   1 month ago Chronic pain of right knee   [place] [facility] [place], Angeline ORN, NP   5 months ago Encounter for general adult medical examination with abnormal findings   [place] [facility] [place], Angeline ORN, NP   8 months ago Primary hypertension   [facility] [place], Angeline ORN, NP                    Requested Prescriptions  Pending Prescriptions Disp Refills   MOUNJARO  7.5 MG/0.5ML Pen [Pharmacy Med Name: MOUNJARO  7.5MG /0.5ML INJ (4 PENS)] 2 mL 0    Sig: ADMINISTER 7.5 MG UNDER THE SKIN 1 TIME A WEEK     Off-Protocol Failed - 09/30/2024  4:06 PM      Failed - Medication not assigned to a protocol, review manually.      Passed - Valid encounter within last 12 months    Recent Outpatient Visits           1 month ago Arthralgia of knee, right   [place] [facility] Ku, Selinda PARAS, MD   1 month ago Chronic pain of right knee   [facility] [place], Angeline ORN, NP   5 months ago Encounter for general adult medical examination with abnormal findings   [place] [facility] [place], Angeline ORN, NP   8  months ago Primary hypertension   [place] [facility] [place], Angeline ORN, [place]

## 2024-10-24 ENCOUNTER — Other Ambulatory Visit: Payer: Self-pay | Admitting: Internal Medicine

## 2024-10-25 ENCOUNTER — Other Ambulatory Visit: Payer: Self-pay | Admitting: Internal Medicine

## 2024-10-26 NOTE — Telephone Encounter (Signed)
 Requested medication (s) are due for refill today - yes  Requested medication (s) are on the active medication list -yes  Future visit scheduled -yes  Last refill: 10/03/24 2ml  Notes to clinic: off protocol- provider review   Requested Prescriptions  Pending Prescriptions Disp Refills   MOUNJARO  7.5 MG/0.5ML Pen [Pharmacy Med Name: MOUNJARO  7.5MG /0.5ML INJ (4 PENS)] 2 mL 0    Sig: ADMINISTER 7.5 MG UNDER THE SKIN 1 TIME A WEEK     Off-Protocol Failed - 10/26/2024  3:07 PM      Failed - Medication not assigned to a protocol, review manually.      Passed - Valid encounter within last 12 months    Recent Outpatient Visits           1 month ago Arthralgia of knee, right   [place] [facility], Selinda PARAS, MD   2 months ago Chronic pain of right knee   [place] [facility] [place], Angeline ORN, NP   5 months ago Encounter for general adult medical examination with abnormal findings   [place] [facility] [place], Angeline ORN, NP   9 months ago Primary hypertension   [place] [facility] [place], Angeline ORN, NP                 Requested Prescriptions  Pending Prescriptions Disp Refills   MOUNJARO  7.5 MG/0.5ML Pen [Pharmacy Med Name: MOUNJARO  7.5MG /0.5ML INJ (4 PENS)] 2 mL 0    Sig: ADMINISTER 7.5 MG UNDER THE SKIN 1 TIME A WEEK     Off-Protocol Failed - 10/26/2024  3:07 PM      Failed - Medication not assigned to a protocol, review manually.      Passed - Valid encounter within last 12 months    Recent Outpatient Visits           1 month ago Arthralgia of knee, right   [place] [facility], Selinda PARAS, MD   2 months ago Chronic pain of right knee   [place] [facility] [place], Angeline ORN, NP   5 months ago Encounter for general adult medical examination with abnormal findings   [place] [facility] [place], Angeline ORN, NP   9 months ago Primary hypertension   [place] [facility] [place], Angeline ORN, [place]

## 2024-10-26 NOTE — Telephone Encounter (Signed)
 Requested Prescriptions  Pending Prescriptions Disp Refills   hydrALAZINE  (APRESOLINE ) 25 MG tablet [Pharmacy Med Name: HYDRALAZINE   25MG  TABLETS(ORANGE)] 180 tablet 1    Sig: TAKE 1 TABLET(25 MG) BY MOUTH TWICE DAILY     Cardiovascular:  Vasodilators Failed - 10/26/2024 11:14 AM      Failed - ANA Screen, Ifa, Serum in normal range and within 360 days    No results found for: ANA, ANATITER, LABANTI       Passed - HCT in normal range and within 360 days    HCT  Date Value Ref Range Status  05/02/2024 43.6 35.0 - 45.0 % Final  06/02/2012 42.6 35.0 - 47.0 % Final         Passed - HGB in normal range and within 360 days    Hemoglobin  Date Value Ref Range Status  05/02/2024 14.5 11.7 - 15.5 g/dL Final   HGB  Date Value Ref Range Status  06/02/2012 14.6 12.0 - 16.0 g/dL Final         Passed - RBC in normal range and within 360 days    RBC  Date Value Ref Range Status  05/02/2024 4.98 3.80 - 5.10 Million/uL Final         Passed - WBC in normal range and within 360 days    WBC  Date Value Ref Range Status  05/02/2024 8.5 3.8 - 10.8 Thousand/uL Final         Passed - PLT in normal range and within 360 days    Platelets  Date Value Ref Range Status  05/02/2024 198 140 - 400 Thousand/uL Final   Platelet  Date Value Ref Range Status  06/02/2012 250 150 - 440 x10 3/mm 3 Final         Passed - Last BP in normal range    BP Readings from Last 1 Encounters:  08/30/24 120/72         Passed - Valid encounter within last 12 months    Recent Outpatient Visits           1 month ago Arthralgia of knee, right   [place] [facility], Selinda PARAS, MD   2 months ago Chronic pain of right knee   [place] [facility] [place], Angeline ORN, NP   5 months ago Encounter for general adult medical examination with abnormal findings   [place] [facility] [place], Angeline ORN, NP   9 months ago Primary  hypertension   [place] [facility] [place], Angeline ORN, [place]

## 2024-10-28 ENCOUNTER — Encounter: Payer: Self-pay | Admitting: Internal Medicine

## 2024-11-01 LAB — OPHTHALMOLOGY REPORT-SCANNED

## 2024-11-03 ENCOUNTER — Other Ambulatory Visit: Payer: Self-pay | Admitting: Family Medicine

## 2024-11-03 DIAGNOSIS — M25561 Pain in right knee: Secondary | ICD-10-CM

## 2024-11-04 ENCOUNTER — Ambulatory Visit: Admitting: Internal Medicine

## 2024-11-04 NOTE — Telephone Encounter (Signed)
 Requested medication (s) are due for refill today - unsure  Requested medication (s) are on the active medication list -yes  Future visit scheduled -no  Last refill: 08/30/24 #30 1RF  Notes to clinic: specialty provider Rx- sent for review   Requested Prescriptions  Pending Prescriptions Disp Refills   meloxicam  (MOBIC ) 15 MG tablet [Pharmacy Med Name: MELOXICAM  15MG  TABLETS] 30 tablet 1    Sig: TAKE 1 TABLET BY MOUTH DAILY FOR 1 WEEK, THEN DAILY AS NEEDED.     Analgesics:  COX2 Inhibitors Failed - 11/04/2024 11:54 AM      Failed - Manual Review: Labs are only required if the patient has taken medication for more than 8 weeks.      Passed - HGB in normal range and within 360 days    Hemoglobin  Date Value Ref Range Status  05/02/2024 14.5 11.7 - 15.5 g/dL Final   HGB  Date Value Ref Range Status  06/02/2012 14.6 12.0 - 16.0 g/dL Final         Passed - Cr in normal range and within 360 days    Creat  Date Value Ref Range Status  08/11/2024 0.93 0.50 - 1.03 mg/dL Final   Creatinine, Urine  Date Value Ref Range Status  05/02/2024 220 20 - 275 mg/dL Final         Passed - HCT in normal range and within 360 days    HCT  Date Value Ref Range Status  05/02/2024 43.6 35.0 - 45.0 % Final  06/02/2012 42.6 35.0 - 47.0 % Final         Passed - AST in normal range and within 360 days    AST  Date Value Ref Range Status  08/11/2024 26 10 - 35 U/L Final   SGOT(AST)  Date Value Ref Range Status  06/02/2012 80 (H) 15 - 37 Unit/L Final         Passed - ALT in normal range and within 360 days    ALT  Date Value Ref Range Status  08/11/2024 27 6 - 29 U/L Final   SGPT (ALT)  Date Value Ref Range Status  06/02/2012 61 U/L Final    Comment:    12-78 NOTE: NEW REFERENCE RANGE 09/26/2011          Passed - eGFR is 30 or above and within 360 days    GFR, Est African American  Date Value Ref Range Status  08/30/2020 103 > OR = 60 mL/min/1.40m2 Final   GFR, Est Non  African American  Date Value Ref Range Status  08/30/2020 89 > OR = 60 mL/min/1.70m2 Final   eGFR  Date Value Ref Range Status  08/11/2024 73 > OR = 60 mL/min/1.34m2 Final         Passed - Patient is not pregnant      Passed - Valid encounter within last 12 months    Recent Outpatient Visits           2 months ago Arthralgia of knee, right   [place] [facility], Selinda PARAS, MD   2 months ago Chronic pain of right knee   [place] [facility] [place] [place], Angeline ORN, NP   6 months ago Encounter for general adult medical examination with abnormal findings   [place] [facility] [place], Angeline ORN, NP   9 months ago Primary hypertension   [facility] [place],  Angeline ORN, NP                 Requested Prescriptions  Pending Prescriptions Disp Refills   meloxicam  (MOBIC ) 15 MG tablet [Pharmacy Med Name: MELOXICAM  15MG  TABLETS] 30 tablet 1    Sig: TAKE 1 TABLET BY MOUTH DAILY FOR 1 WEEK, THEN DAILY AS NEEDED.     Analgesics:  COX2 Inhibitors Failed - 11/04/2024 11:54 AM      Failed - Manual Review: Labs are only required if the patient has taken medication for more than 8 weeks.      Passed - HGB in normal range and within 360 days    Hemoglobin  Date Value Ref Range Status  05/02/2024 14.5 11.7 - 15.5 g/dL Final   HGB  Date Value Ref Range Status  06/02/2012 14.6 12.0 - 16.0 g/dL Final         Passed - Cr in normal range and within 360 days    Creat  Date Value Ref Range Status  08/11/2024 0.93 0.50 - 1.03 mg/dL Final   Creatinine, Urine  Date Value Ref Range Status  05/02/2024 220 20 - 275 mg/dL Final         Passed - HCT in normal range and within 360 days    HCT  Date Value Ref Range Status  05/02/2024 43.6 35.0 - 45.0 % Final  06/02/2012 42.6 35.0 - 47.0 % Final         Passed - AST in normal range and within 360 days    AST  Date Value Ref Range Status   08/11/2024 26 10 - 35 U/L Final   SGOT(AST)  Date Value Ref Range Status  06/02/2012 80 (H) 15 - 37 Unit/L Final         Passed - ALT in normal range and within 360 days    ALT  Date Value Ref Range Status  08/11/2024 27 6 - 29 U/L Final   SGPT (ALT)  Date Value Ref Range Status  06/02/2012 61 U/L Final    Comment:    12-78 NOTE: NEW REFERENCE RANGE 09/26/2011          Passed - eGFR is 30 or above and within 360 days    GFR, Est African American  Date Value Ref Range Status  08/30/2020 103 > OR = 60 mL/min/1.48m2 Final   GFR, Est Non African American  Date Value Ref Range Status  08/30/2020 89 > OR = 60 mL/min/1.102m2 Final   eGFR  Date Value Ref Range Status  08/11/2024 73 > OR = 60 mL/min/1.49m2 Final         Passed - Patient is not pregnant      Passed - Valid encounter within last 12 months    Recent Outpatient Visits           2 months ago Arthralgia of knee, right   [place] [facility], Selinda PARAS, MD   2 months ago Chronic pain of right knee   [place] [facility] [place], Angeline ORN, NP   6 months ago Encounter for general adult medical examination with abnormal findings   [place] [facility] [place], Angeline ORN, NP   9 months ago Primary hypertension   [place] [facility] [place], Angeline ORN, [place]

## 2024-11-04 NOTE — Telephone Encounter (Signed)
 Please review.  KP

## 2024-11-05 ENCOUNTER — Other Ambulatory Visit: Payer: Self-pay | Admitting: Internal Medicine

## 2024-11-07 NOTE — Telephone Encounter (Signed)
 Requested by interface surescripts. Future visit 11/21/24.  Requested Prescriptions  Pending Prescriptions Disp Refills   olmesartan  (BENICAR ) 40 MG tablet [Pharmacy Med Name: OLMESARTAN  MEDOXOMIL 40MG  TABLETS] 90 tablet 0    Sig: TAKE 1 TABLET(40 MG) BY MOUTH DAILY     Cardiovascular:  Angiotensin Receptor Blockers Failed - 11/07/2024  1:41 PM      Failed - Valid encounter within last 6 months    Recent Outpatient Visits           2 months ago Arthralgia of knee, right   [place] [facility] [place], Selinda PARAS, MD   2 months ago Chronic pain of right knee   [place] [facility] [place], Angeline ORN, NP   6 months ago Encounter for general adult medical examination with abnormal findings   [place] [facility] [place], Angeline ORN, NP   9 months ago Primary hypertension    [facility] [place], [place] W, NP              Passed - Cr in normal range and within 180 days    Creat  Date Value Ref Range Status  08/11/2024 0.93 0.50 - 1.03 mg/dL Final   Creatinine, Urine  Date Value Ref Range Status  05/02/2024 220 20 - 275 mg/dL Final         Passed - K in normal range and within 180 days    Potassium  Date Value Ref Range Status  08/11/2024 4.3 3.5 - 5.3 mmol/L Final  06/02/2012 3.9 3.5 - 5.1 mmol/L Final         Passed - Patient is not pregnant      Passed - Last BP in normal range    BP Readings from Last 1 Encounters:  08/30/24 120/72

## 2024-11-21 ENCOUNTER — Ambulatory Visit: Admitting: Internal Medicine

## 2024-11-21 ENCOUNTER — Encounter: Payer: Self-pay | Admitting: Internal Medicine

## 2024-11-21 VITALS — BP 120/74 | Ht 67.0 in | Wt 203.4 lb

## 2024-11-21 DIAGNOSIS — K760 Fatty (change of) liver, not elsewhere classified: Secondary | ICD-10-CM

## 2024-11-21 DIAGNOSIS — M25561 Pain in right knee: Secondary | ICD-10-CM

## 2024-11-21 DIAGNOSIS — E66811 Obesity, class 1: Secondary | ICD-10-CM

## 2024-11-21 DIAGNOSIS — Z7985 Long-term (current) use of injectable non-insulin antidiabetic drugs: Secondary | ICD-10-CM | POA: Diagnosis not present

## 2024-11-21 DIAGNOSIS — E785 Hyperlipidemia, unspecified: Secondary | ICD-10-CM | POA: Diagnosis not present

## 2024-11-21 DIAGNOSIS — Z7984 Long term (current) use of oral hypoglycemic drugs: Secondary | ICD-10-CM | POA: Diagnosis not present

## 2024-11-21 DIAGNOSIS — E1169 Type 2 diabetes mellitus with other specified complication: Secondary | ICD-10-CM

## 2024-11-21 DIAGNOSIS — E1165 Type 2 diabetes mellitus with hyperglycemia: Secondary | ICD-10-CM

## 2024-11-21 DIAGNOSIS — G8929 Other chronic pain: Secondary | ICD-10-CM

## 2024-11-21 DIAGNOSIS — E6609 Other obesity due to excess calories: Secondary | ICD-10-CM

## 2024-11-21 DIAGNOSIS — I152 Hypertension secondary to endocrine disorders: Secondary | ICD-10-CM

## 2024-11-21 DIAGNOSIS — E1159 Type 2 diabetes mellitus with other circulatory complications: Secondary | ICD-10-CM | POA: Diagnosis not present

## 2024-11-21 MED ORDER — TIRZEPATIDE 10 MG/0.5ML ~~LOC~~ SOAJ: 10.0000 mg | SUBCUTANEOUS | 1 refills | Status: AC

## 2024-11-21 NOTE — Assessment & Plan Note (Signed)
 Complicated by obesity Lipid profile reviewed Encouraged her to consume low-fat diet Continue rosuvastatin  10 mg daily

## 2024-11-21 NOTE — Patient Instructions (Signed)
 Exercises for Chronic Knee Pain  Chronic knee pain is pain that lasts longer than 3 months. For most people with chronic knee pain, exercise and weight loss is an important part of treatment. Your health care provider may want you to focus on:  Making the muscles that support your knee stronger. This can take pressure off your knee and reduce pain.  Preventing knee stiffness.  How far you can move your knee, keeping it there or making it farther.  Losing weight (if this applies) to take pressure off your knee, lower your risk for injury, and make it easier for you to exercise.  Your provider will help you make an exercise program that fits your needs and physical abilities. Below are simple, low-impact exercises you can do at home. Ask your provider or physical therapist how often you should do your exercise program and how many times to repeat each exercise.  General safety tips    Get your provider's approval before doing any exercises.  Start slowly and stop any time you feel pain.  Do not exercise if your knee pain is flaring up.  Warm up first. Stretching a cold muscle can cause an injury. Do 5-10 minutes of easy movement or light stretching before beginning your exercises.  Do 5-10 minutes of low-impact activity (like walking or cycling) before starting strengthening exercises.  Contact your provider any time you have pain during or after exercising. Exercise can cause discomfort but should not be painful. It is normal to be a little stiff or sore after exercising.  Stretching and range-of-motion exercises  Front thigh stretch    Stand up straight and support your body by holding on to a chair or resting one hand on a Mastandrea.  With your legs straight and close together, bend one knee to lift your heel up toward your butt.  Using one hand for support, grab your ankle with your free hand.  Pull your foot up closer toward your butt to feel the stretch in front of your thigh.  Hold the stretch for 30  seconds.  Repeat __________ times. Complete this exercise __________ times a day.  Back thigh stretch    Sit on the floor with your back straight and your legs out straight in front of you.  Place the palms of your hands on the floor and slide them toward your feet as you bend at the hip.  Try to touch your nose to your knees and feel the stretch in the back of your thighs.  Hold for 30 seconds.  Repeat __________ times. Complete this exercise __________ times a day.  Calf stretch    Stand facing a Sheehy.  Place the palms of your hands flat against the Ghazi, arms extended, and lean slightly against the Muha.  Get into a lunge position with one leg bent at the knee and the other leg stretched out straight behind you.  Keep both feet facing the Ziesmer and increase the bend in your knee while keeping the heel of the other leg flat on the ground.  You should feel the stretch in your calf. Hold for 30 seconds.  Repeat __________ times. Complete this exercise __________ times a day.  Strengthening exercises  Straight leg lift    Lie on your back with one knee bent and the other leg out straight.  Slowly lift the straight leg without bending the knee.  Lift until your foot is about 12 inches (30 cm) off the floor.  Hold for  3-5 seconds and slowly lower your leg.  Repeat __________ times. Complete this exercise __________ times a day.  Single leg dip    Stand between two chairs and put both hands on the backs of the chairs for support.  Extend one leg out straight with your body weight resting on the heel of the standing leg.  Slowly bend your standing knee to dip your body to the level that is comfortable for you.  Hold for 3-5 seconds.  Repeat __________ times. Complete this exercise __________ times a day.  Hamstring curls    Stand straight, knees close together, facing the back of a chair.  Hold on to the back of a chair with both hands.  Keep one leg straight. Bend the other knee while bringing the heel up toward the butt  until the knee is bent at a 90-degree angle (right angle).  Hold for 3-5 seconds.  Repeat __________ times. Complete this exercise __________ times a day.  Neece squat    Stand straight with your back, hips, and head against a Rushing.  Step forward one foot at a time with your back still against the Spargur.  Your feet should be 2 feet (61 cm) from the Jeanpaul at shoulder width. Keeping your back, hips, and head against the Colligan, slide down the Brenner to as close to a sitting position as you can get.  Hold for 5-10 seconds, then slowly slide back up.  Repeat __________ times. Complete this exercise __________ times a day.  Step-ups    Stand in front of a sturdy platform or stool that is about 6 inches (15 cm) high.  Slowly step up with your left / right foot, keeping your knee in line with your hip and foot. Do not let your knee bend so far that you cannot see your toes. Hold on to a chair for balance, but do not use it for support.  Slowly unlock your knee and lower yourself to the starting position.  Repeat __________ times. Complete this exercise __________ times a day.  Contact a health care provider if:  Your exercises cause pain.  Your pain is worse after you exercise.  Your pain prevents you from doing your exercises.  This information is not intended to replace advice given to you by your health care provider. Make sure you discuss any questions you have with your health care provider.  Document Revised: 11/04/2022 Document Reviewed: 11/04/2022  Elsevier Patient Education  2024 [employer].

## 2024-11-21 NOTE — Assessment & Plan Note (Signed)
 Complicated by obesity Controlled on olmesartan  40 mg daily and hydralazine  25 mg twice daily Reinforced DASH diet and exercise for weight loss Kidney function reviewed

## 2024-11-21 NOTE — Progress Notes (Signed)
 "  Subjective:    Patient ID: Elizabeth Dalton, female    DOB: [birth year], 54 y.o.   MRN: [id number]  HPI  Patient presents to clinic today for follow-up of chronic conditions.  HTN: Associated with diabetes.  Her BP today is 120/74.  She is taking olmesartan  and hydralazine  as prescribed.  She reports history of whitecoat syndrome but does not monitor her blood pressure at home.  ECG from 05/2012 reviewed.  DM2: Her last A1c was 6.3%, 08/2024.  She is taking metformin  and tirzepatide  as prescribed.  She does not check her sugars.  She checks her feet routinely.  Her last eye exam was 10/2024.  Flu 08/2024.  Pneumovax 02/2021.  Prevnar 04/2024.  COVID x 3.  HLD: Associated with diabetes.  Her last LDL was 41, triglycerides 81, 08/2024.  She denies myalgias on rosuvastatin .  She tries to consume low-fat diet.  Chronic cough: Resolved after discontinuation of lisinopril .  Chronic right knee pain: Managed with meloxicam  as needed.  MRI from 09/2024 reviewed. It was recommended that she undergo PT but she plans to hold off at this time. She follows with sports medicine.  Metabolic associated fatty liver disease: Her last AST/ALT was 26/27, 08/2024.  Ultrasound abdomen from 05/2024 reviewed.  She does not follow with GI.  Review of Systems  Past Medical History:  Diagnosis Date   Allergy     Current Outpatient Medications  Medication Sig Dispense Refill   hydrALAZINE  (APRESOLINE ) 25 MG tablet TAKE 1 TABLET(25 MG) BY MOUTH TWICE DAILY 180 tablet 1   levocetirizine (XYZAL) 5 MG tablet Take 5 mg by mouth every evening.     meloxicam  (MOBIC ) 15 MG tablet Take 1 tablet (15 mg total) by mouth daily. X 1 week then daily PRN 30 tablet 1   metFORMIN  (GLUCOPHAGE ) 1000 MG tablet Take 1 tablet (1,000 mg total) by mouth 2 (two) times daily with a meal. 180 tablet 1   MOUNJARO  7.5 MG/0.5ML Pen ADMINISTER 7.5 MG UNDER THE SKIN 1 TIME A WEEK 2 mL 0   MULTIPLE VITAMINS PO Take by mouth.      olmesartan  (BENICAR ) 40 MG tablet TAKE 1 TABLET(40 MG) BY MOUTH DAILY 90 tablet 0   rosuvastatin  (CRESTOR ) 10 MG tablet TAKE 1 TABLET(10 MG) BY MOUTH DAILY 90 tablet 1   No current facility-administered medications for this visit.    Allergies  Allergen Reactions   Latex Rash    Family History  Problem Relation Age of Onset   Lung cancer Mother    Heart disease Mother    Hypertension Mother    Diabetes Mother        T1DM   Heart disease Father    Hypertension Father    Diabetes Sister    Diabetes Sister    Dementia Maternal Grandmother    Liver disease Paternal Grandfather    Alcohol abuse Paternal Grandfather    Breast cancer Neg Hx     Social History   Socioeconomic History   Marital status: Married    Spouse name: Not on file   Number of children: Not on file   Years of education: Not on file   Highest education level: Bachelor's degree (e.g., BA, AB, BS)  Occupational History   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  Tobacco Use   Smoking status: Never   Smokeless tobacco: Never  Vaping Use   Vaping status: Never Used  Substance  and Sexual Activity   Alcohol use: Yes    Alcohol/week: 1.0 standard drink of alcohol    Types: 1 Standard drinks or equivalent per week    Comment: occasionally    Drug use: Never   Sexual activity: Yes  Other Topics Concern   Not on file  Social History Narrative   Not on file   Social Drivers of Health   Tobacco Use: Low Risk (09/02/2024)   Patient History    Smoking Tobacco Use: Never    Smokeless Tobacco Use: Never    Passive Exposure: Not on file  Financial Resource Strain: Low Risk (10/25/2023)   Overall Financial Resource Strain (CARDIA)    Difficulty of Paying Living Expenses: Not hard at all  Food Insecurity: No Food Insecurity (10/25/2023)   Hunger Vital Sign    Worried About Running Out of Food in the Last Year: Never true    Ran Out of Food in the  Last Year: Never true  Transportation Needs: No Transportation Needs (10/25/2023)   PRAPARE - Administrator, Civil Service (Medical): No    Lack of Transportation (Non-Medical): No  Physical Activity: Insufficiently Active (10/25/2023)   Exercise Vital Sign    Days of Exercise per Week: 2 days    Minutes of Exercise per Session: 30 min  Stress: Stress Concern Present (10/25/2023)   [employer] of Occupational Health - Occupational Stress Questionnaire    Feeling of Stress : To some extent  Social Connections: Moderately Isolated (10/25/2023)   Social Connection and Isolation Panel    Frequency of Communication with Friends and Family: Once a week    Frequency of Social Gatherings with Friends and Family: Once a week    Attends Religious Services: More than 4 times per year    Active Member of [employer] or Organizations: No    Attends Engineer, Structural: Not on file    Marital Status: Married  Catering Manager Violence: Not on file  Depression (PHQ2-9): Low Risk (08/30/2024)   Depression (PHQ2-9)    PHQ-2 Score: 0  Alcohol Screen: Low Risk (10/25/2023)   Alcohol Screen    Last Alcohol Screening Score (AUDIT): 1  Housing: Low Risk (10/25/2023)   Housing Stability Vital Sign    Unable to Pay for Housing in the Last Year: No    Number of Times Moved in the Last Year: 0    Homeless in the Last Year: No  Utilities: Not on file  Health Literacy: Not on file     Constitutional: Denies fever, malaise, fatigue, headache or abrupt weight changes.  HEENT:  Denies eye pain, eye redness, ear pain, ringing in the ears, wax buildup, runny nose, nasal congestion, bloody nose, or sore throat. Respiratory: Denies difficulty breathing, shortness of breath, cough or sputum production.   Cardiovascular: Denies chest pain, chest tightness, palpitations or swelling in the hands or feet.  Gastrointestinal: Denies abdominal pain, bloating, constipation, diarrhea or blood in  the stool.  GU: Denies urgency, frequency, pain with urination, burning sensation, blood in urine, odor or discharge. Musculoskeletal: Patient reports chronic right knee pain.  Denies decrease in range of motion, difficulty with gait, muscle pain or joint swelling.  Skin: Denies redness, rashes, lesions or ulcercations.  Neurological: Denies dizziness, difficulty with memory, difficulty with speech or problems with balance and coordination.  Psych: Denies anxiety, depression, SI/HI.  No other specific complaints in a complete review of systems (except as listed in HPI above).  Objective:   Physical Exam  BP 120/74 (BP Location: Right Arm, Patient Position: Sitting, Cuff Size: Normal)   Ht 5' 7 (1.702 m)   Wt 203 lb 6.4 oz (92.3 kg)   LMP  (LMP Unknown)   BMI 31.86 kg/m     Wt Readings from Last 3 Encounters:  08/30/24 215 lb (97.5 kg)  08/17/24 215 lb (97.5 kg)  05/02/24 236 lb 6.4 oz (107.2 kg)    General: Appears her stated age, obese, in NAD. Skin: Warm, dry and intact. No ulcerations noted. Cardiovascular: Normal rate and rhythm. S1,S2 noted.  No murmur, rubs or gallops noted. No JVD or BLE edema. No carotid bruits noted. Pulmonary/Chest: Normal effort and positive vesicular breath sounds. No respiratory distress. No wheezes, rales or ronchi noted.  Musculoskeletal: Joint enlargement of right knee. No difficulty with gait.  Neurological: Alert and oriented.  Coordination normal.      BMET    Component Value Date/Time   NA 139 08/11/2024 1605   NA 139 06/02/2012 1926   K 4.3 08/11/2024 1605   K 3.9 06/02/2012 1926   CL 103 08/11/2024 1605   CL 103 06/02/2012 1926   CO2 27 08/11/2024 1605   CO2 26 06/02/2012 1926   GLUCOSE 87 08/11/2024 1605   GLUCOSE 129 (H) 06/02/2012 1926   BUN 16 08/11/2024 1605   BUN 10 06/02/2012 1926   CREATININE 0.93 08/11/2024 1605   CALCIUM  9.8 08/11/2024 1605   CALCIUM  8.7 06/02/2012 1926   GFRNONAA 89 08/30/2020 1023   GFRAA  103 08/30/2020 1023    Lipid Panel     Component Value Date/Time   CHOL 108 08/11/2024 1605   TRIG 81 08/11/2024 1605   HDL 51 08/11/2024 1605   CHOLHDL 2.1 08/11/2024 1605   LDLCALC 41 08/11/2024 1605    CBC    Component Value Date/Time   WBC 8.5 05/02/2024 0931   RBC 4.98 05/02/2024 0931   HGB 14.5 05/02/2024 0931   HGB 14.6 06/02/2012 1926   HCT 43.6 05/02/2024 0931   HCT 42.6 06/02/2012 1926   PLT 198 05/02/2024 0931   PLT 250 06/02/2012 1926   MCV 87.6 05/02/2024 0931   MCV 85 06/02/2012 1926   MCH 29.1 05/02/2024 0931   MCHC 33.3 05/02/2024 0931   RDW 12.5 05/02/2024 0931   RDW 13.5 06/02/2012 1926   LYMPHSABS 2,936 08/30/2020 1023   LYMPHSABS 3.3 05/17/2012 0630   MONOABS 0.6 05/17/2012 0630   EOSABS 98 08/30/2020 1023   EOSABS 0.2 05/17/2012 0630   BASOSABS 41 08/30/2020 1023   BASOSABS 0.0 05/17/2012 0630    Hgb A1C Lab Results  Component Value Date   HGBA1C 6.3 (H) 08/11/2024           Assessment & Plan:    RTC in 6 months for your annual exam Angeline Laura, NP  "

## 2024-11-21 NOTE — Assessment & Plan Note (Signed)
 Encourage diet and exercise for weight loss

## 2024-11-21 NOTE — Assessment & Plan Note (Signed)
" °  Complicated by obesity  A1c reviewed Urine microalbumin has been checked within the last year Continue metformin  1000 mg twice daily Will increase mounjaro  to 10 mg weekly Encourage low-carb diet and exercise for weight loss Encourage routine eye exam Encourage routine foot exam Flu shot UTD Pneumovax and Prevnar UTD Encouraged her to get her COVID booster "

## 2024-11-21 NOTE — Assessment & Plan Note (Signed)
 Encouraged weight loss as this can help reduce joint pain Continue meloxicam  15 mg daily as needed

## 2024-11-21 NOTE — Assessment & Plan Note (Signed)
 Liver function reviewed Encouraged low saturated fat and exercise for weight loss

## 2024-12-08 ENCOUNTER — Ambulatory Visit: Admitting: Physical Therapy

## 2024-12-09 ENCOUNTER — Other Ambulatory Visit: Payer: Self-pay | Admitting: Internal Medicine

## 2024-12-15 ENCOUNTER — Ambulatory Visit: Admitting: Physical Therapy

## 2024-12-22 ENCOUNTER — Ambulatory Visit: Admitting: Physical Therapy

## 2024-12-29 ENCOUNTER — Ambulatory Visit: Admitting: Physical Therapy

## 2025-01-05 ENCOUNTER — Ambulatory Visit: Admitting: Physical Therapy

## 2025-01-12 ENCOUNTER — Ambulatory Visit: Admitting: Physical Therapy

## 2025-01-19 ENCOUNTER — Ambulatory Visit: Admitting: Physical Therapy

## 2025-01-26 ENCOUNTER — Ambulatory Visit: Admitting: Physical Therapy

## 2025-02-02 ENCOUNTER — Ambulatory Visit: Admitting: Physical Therapy

## 2025-05-03 ENCOUNTER — Encounter: Admitting: Internal Medicine
